# Patient Record
Sex: Female | Born: 1960
Health system: Southern US, Community
[De-identification: ages and names within clinical notes are randomized; demographics above are authoritative.]

## PROBLEM LIST (undated history)

## (undated) DIAGNOSIS — K5190 Ulcerative colitis, unspecified, without complications: Secondary | ICD-10-CM

## (undated) DIAGNOSIS — E785 Hyperlipidemia, unspecified: Secondary | ICD-10-CM

## (undated) DIAGNOSIS — K625 Hemorrhage of anus and rectum: Secondary | ICD-10-CM

## (undated) HISTORY — PX: WRIST SURGERY: SHX841

## (undated) HISTORY — PX: TUBAL LIGATION: SHX77

## (undated) HISTORY — DX: Hemorrhage of anus and rectum: K62.5

## (undated) HISTORY — DX: Ulcerative colitis, unspecified, without complications: K51.90

## (undated) HISTORY — DX: Hyperlipidemia, unspecified: E78.5

---

## 2002-03-19 ENCOUNTER — Emergency Department (HOSPITAL_COMMUNITY): Admission: EM | Admit: 2002-03-19 | Discharge: 2002-03-19 | Payer: Self-pay | Admitting: *Deleted

## 2002-03-27 ENCOUNTER — Encounter: Payer: Self-pay | Admitting: Emergency Medicine

## 2002-03-27 ENCOUNTER — Emergency Department (HOSPITAL_COMMUNITY): Admission: EM | Admit: 2002-03-27 | Discharge: 2002-03-27 | Payer: Self-pay | Admitting: Emergency Medicine

## 2005-08-21 ENCOUNTER — Ambulatory Visit (HOSPITAL_COMMUNITY): Admission: RE | Admit: 2005-08-21 | Discharge: 2005-08-21 | Payer: Self-pay | Admitting: Family Medicine

## 2006-05-13 ENCOUNTER — Emergency Department (HOSPITAL_COMMUNITY): Admission: EM | Admit: 2006-05-13 | Discharge: 2006-05-13 | Payer: Self-pay | Admitting: Emergency Medicine

## 2007-01-02 IMAGING — CR DG CHEST 2V
2 series · 2 of 2 positions shown · non-contrast
Comparison: None.

CLINICAL DATA: Asthma. Shortness of breath.

[view not recorded (1 of 2)]
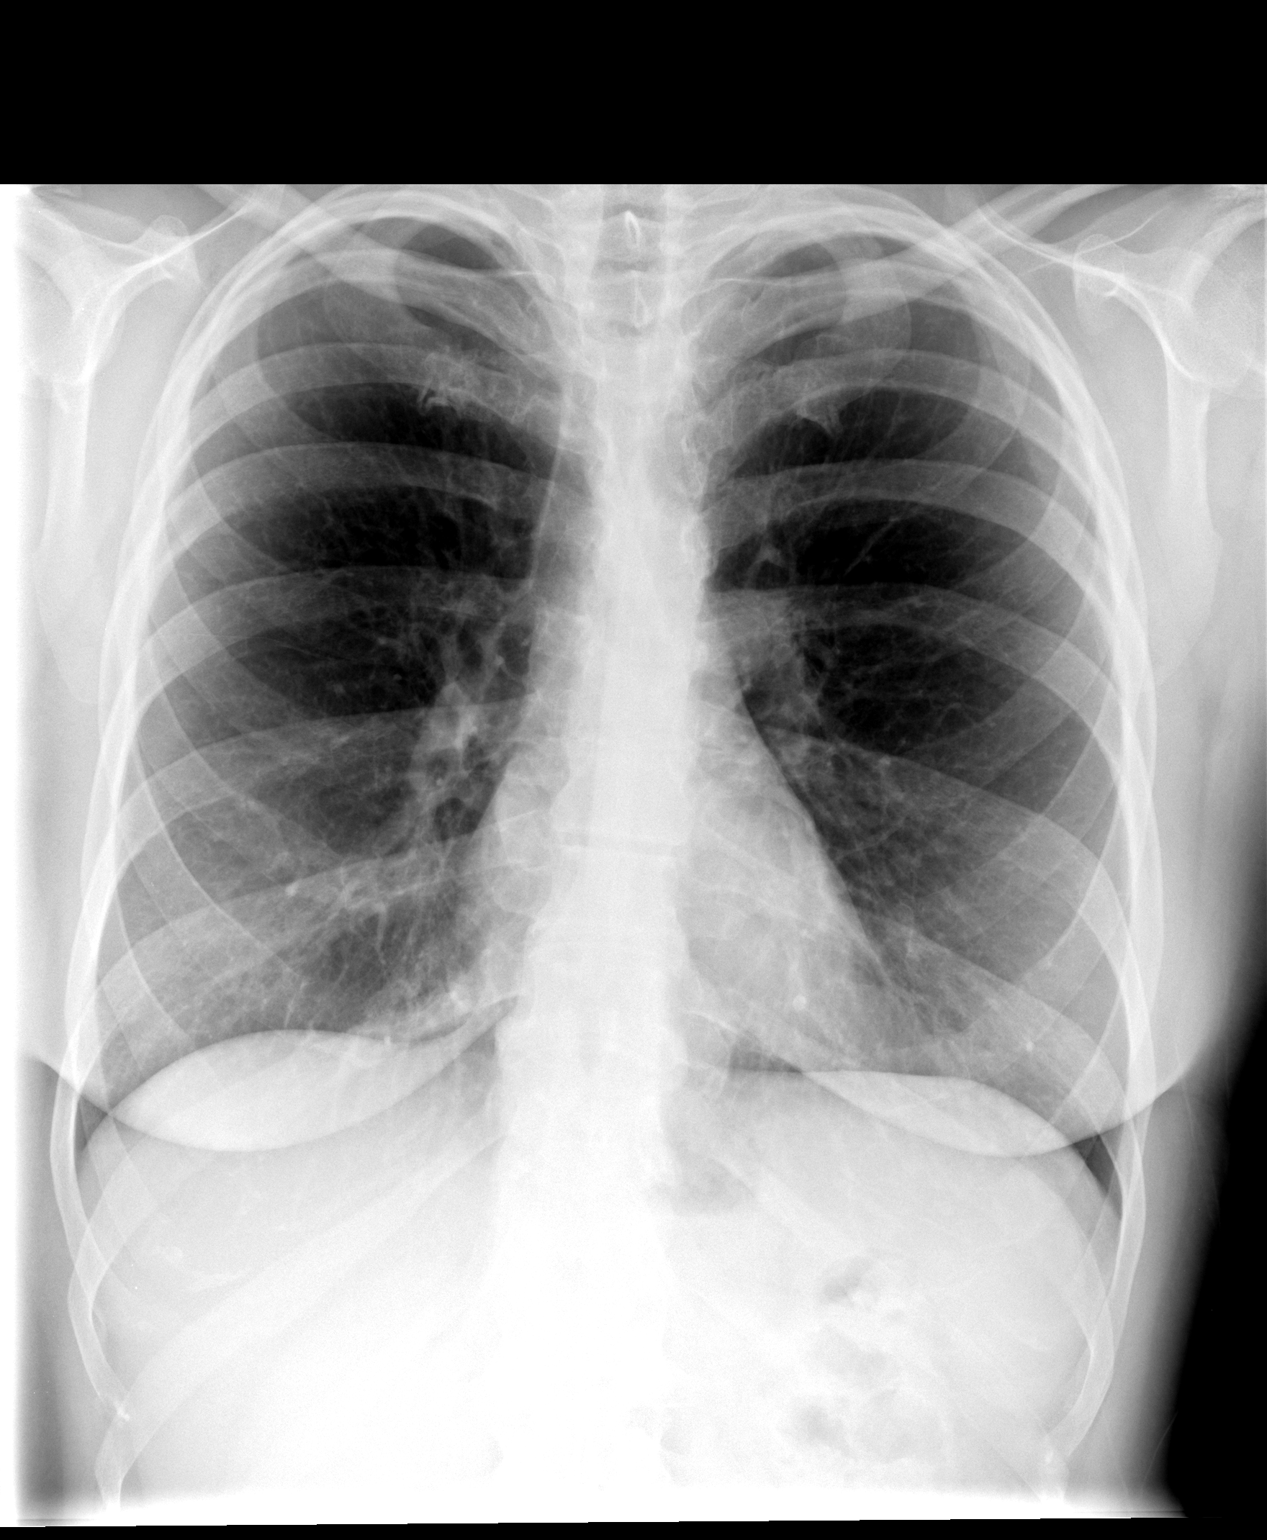

[view not recorded (2 of 2)]
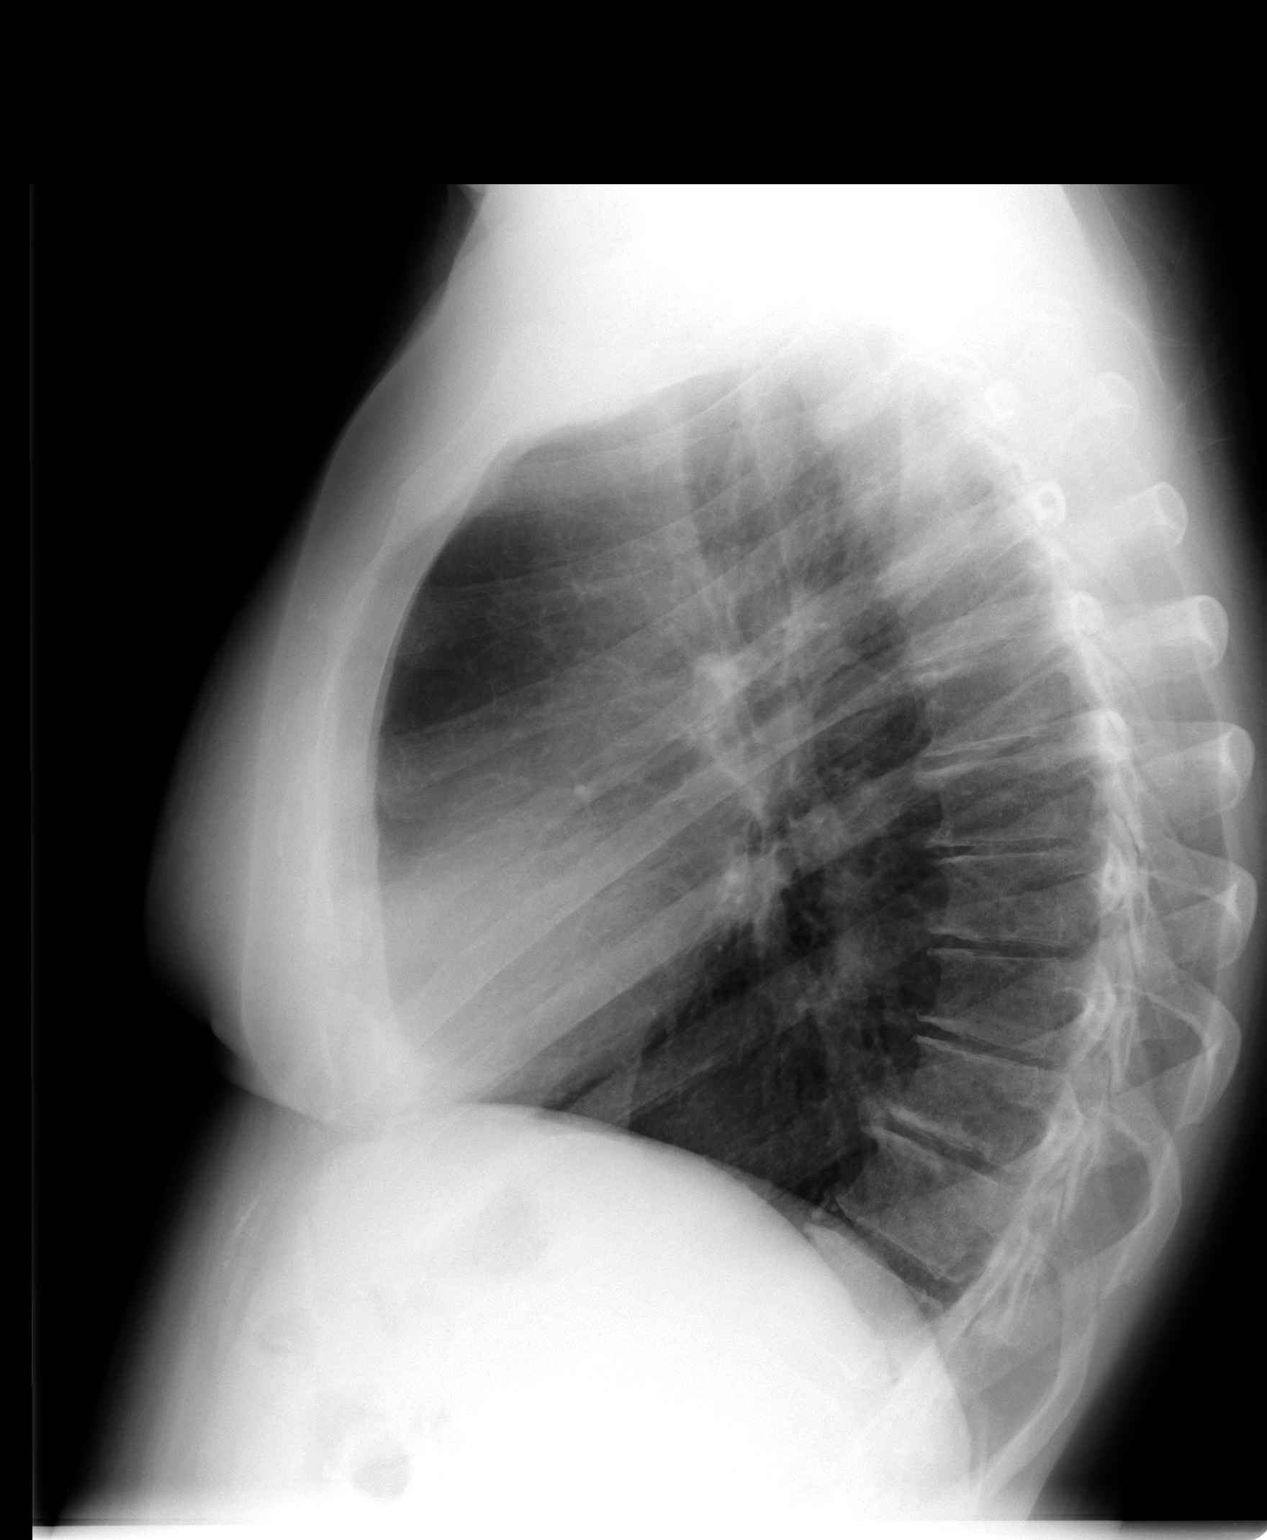

[2 of 2 positions shown; findings below may reference images not displayed]

CHEST - 2 VIEW:

The lungs are clear.  The cardiopericardial silhouette is within normal limits
for size.  Visualized bony structures of the thorax are intact.
IMPRESSION: No acute cardiopulmonary process

## 2007-06-28 ENCOUNTER — Ambulatory Visit (HOSPITAL_COMMUNITY): Admission: RE | Admit: 2007-06-28 | Discharge: 2007-06-28 | Payer: Self-pay | Admitting: Family Medicine

## 2007-09-15 ENCOUNTER — Emergency Department (HOSPITAL_COMMUNITY): Admission: EM | Admit: 2007-09-15 | Discharge: 2007-09-15 | Payer: Self-pay | Admitting: Emergency Medicine

## 2010-07-21 ENCOUNTER — Ambulatory Visit (HOSPITAL_COMMUNITY): Admission: RE | Admit: 2010-07-21 | Discharge: 2010-07-21 | Payer: Self-pay | Admitting: Family Medicine

## 2011-04-02 ENCOUNTER — Encounter: Payer: Self-pay | Admitting: *Deleted

## 2011-04-02 ENCOUNTER — Emergency Department (HOSPITAL_COMMUNITY)
Admission: EM | Admit: 2011-04-02 | Discharge: 2011-04-02 | Disposition: A | Discharge status: Home / Self Care | Payer: PRIVATE HEALTH INSURANCE | Attending: Emergency Medicine | Admitting: Emergency Medicine

## 2011-04-02 ENCOUNTER — Emergency Department (HOSPITAL_COMMUNITY): Payer: PRIVATE HEALTH INSURANCE

## 2011-04-02 DIAGNOSIS — T148XXA Other injury of unspecified body region, initial encounter: Secondary | ICD-10-CM

## 2011-04-02 DIAGNOSIS — IMO0002 Reserved for concepts with insufficient information to code with codable children: Secondary | ICD-10-CM | POA: Insufficient documentation

## 2011-04-02 DIAGNOSIS — J45909 Unspecified asthma, uncomplicated: Secondary | ICD-10-CM | POA: Insufficient documentation

## 2011-04-02 DIAGNOSIS — S40019A Contusion of unspecified shoulder, initial encounter: Secondary | ICD-10-CM | POA: Insufficient documentation

## 2011-04-02 DIAGNOSIS — W010XXA Fall on same level from slipping, tripping and stumbling without subsequent striking against object, initial encounter: Secondary | ICD-10-CM | POA: Insufficient documentation

## 2011-04-02 DIAGNOSIS — Y9269 Other specified industrial and construction area as the place of occurrence of the external cause: Secondary | ICD-10-CM | POA: Insufficient documentation

## 2011-04-02 NOTE — ED Notes (Signed)
Pt states she slipped in grease this am at work and hit her left shoulder.

## 2011-04-02 NOTE — ED Provider Notes (Signed)
History     CSN: [id number] Arrival date & time: 04/02/2011 12:34 PM  Chief Complaint  Patient presents with  . Fall    pt slipped in grease and hit left shoulder   HPI Comments: Patient c/o pain to her left scapula after she slipped, fell at work.  States she landed on her left shoulder.  She denies other injuries, numbness, weakness or LOC.  Patient is a 50 y.o. female presenting with shoulder injury. The history is provided by the patient.  Shoulder Injury This is a new problem. The current episode started today. The problem occurs constantly. The problem has been gradually improving. Associated symptoms include arthralgias. Pertinent negatives include no abdominal pain, chest pain, fever, joint swelling, nausea, neck pain, numbness, vomiting or weakness. Exacerbated by: movement. She has tried nothing for the symptoms. The treatment provided no relief.    Past Medical History  Diagnosis Date  . Asthma     History reviewed. No pertinent past surgical history.  History reviewed. No pertinent family history.  History  Substance Use Topics  . Smoking status: Never Smoker   . Smokeless tobacco: Never Used  . Alcohol Use: No    OB History    Grav Para Term Preterm Abortions TAB SAB Ect Mult Living                  Review of Systems  Constitutional: Negative for fever.  HENT: Negative for neck pain and neck stiffness.   Eyes: Negative for visual disturbance.  Cardiovascular: Negative for chest pain.  Gastrointestinal: Negative for nausea, vomiting and abdominal pain.  Musculoskeletal: Positive for arthralgias. Negative for joint swelling.  Neurological: Negative for weakness and numbness.  Hematological: Does not bruise/bleed easily.  All other systems reviewed and are negative.    Physical Exam  BP 149/71  Pulse 95  Temp(Src) 98.2 F (36.8 C) (Oral)  Resp 20  Ht 5' 4"  (1.626 m)  Wt 160 lb (72.576 kg)  BMI 27.46 kg/m2  SpO2 100%  LMP 02/01/2011  Physical  Exam  Nursing note and vitals reviewed. Constitutional: She is oriented to person, place, and time. She appears well-developed and well-nourished. No distress.  HENT:  Head: Normocephalic and atraumatic.  Mouth/Throat: Oropharynx is clear and moist.  Neck: Normal range of motion. Neck supple.  Cardiovascular: Normal rate, regular rhythm and normal heart sounds.   Pulmonary/Chest: Effort normal and breath sounds normal.  Musculoskeletal: She exhibits tenderness. She exhibits no edema.       Arms: Lymphadenopathy:    She has no cervical adenopathy.  Neurological: She is oriented to person, place, and time. She has normal reflexes. She exhibits normal muscle tone. Coordination normal.  Skin: Skin is warm and dry.    ED Course  Procedures  MDM  Small superificial abrasion to left scapula with ttp of that area.  Pain also reproduced with abduction of the left arm.  No spinal tenderness, LOC, neck pain or headache.  Distal sensation intact,  Radial pulse is strong.  I have reviewed the x-ray results and discussed them with the patient.       Dg Scapula Left  04/02/2011  *RADIOLOGY REPORT*  Clinical Data: Fall, pain.  LEFT SCAPULA - 2+ VIEWS  Comparison: None.  Findings: There is an area of mild cortical irregularity noted in the inferior aspect of the scapula on the second view (Y-view). This cannot be confirmed on the second view which is suboptimal. No proximal humeral abnormality.  IMPRESSION: Subtle area  of cortical irregularity seen only on the Y-view.  I would recommend a three-view shoulder for better visualization if there is high clinical suspicion.  Original Report Authenticated By: Raelyn Number, M.D.   Dg Shoulder Left  04/02/2011  *RADIOLOGY REPORT*  Clinical Data: Fall.  Abnormal scapular views.  LEFT SHOULDER - 2+ VIEW  Comparison: 04/02/2011, 1442 hours.  Findings: Left shoulder is located.  Internal and external rotation views are normal.  Scapula is within normal limits.   There is a small area of cortical prominence in the inferior scapula representing the area of irregularity seen on prior exam, which is compatible with an area of muscular attachment and a small tug lesion.  Visualized left chest is unremarkable.  AC joint appears within normal limits.  IMPRESSION: No scapular fracture. Acute osseous injury.  Original Report Authenticated By: Dereck Ligas, M.D.     Sundai Probert L. Vanessa [place], [place] 04/09/11 2013

## 2011-04-12 NOTE — ED Provider Notes (Signed)
Medical screening examination/treatment/procedure(s) were performed by non-physician practitioner and as supervising physician I was immediately available for consultation/collaboration.   Hoy Morn, MD 04/12/11 [phone]

## 2011-08-28 ENCOUNTER — Other Ambulatory Visit (HOSPITAL_COMMUNITY): Payer: Self-pay | Admitting: Family Medicine

## 2011-08-28 DIAGNOSIS — Z139 Encounter for screening, unspecified: Secondary | ICD-10-CM

## 2011-09-04 ENCOUNTER — Ambulatory Visit (HOSPITAL_COMMUNITY)
Admission: RE | Admit: 2011-09-04 | Discharge: 2011-09-04 | Disposition: A | Discharge status: Home / Self Care | Payer: 59 | Source: Ambulatory Visit | Attending: Family Medicine | Admitting: Family Medicine

## 2011-09-04 DIAGNOSIS — Z139 Encounter for screening, unspecified: Secondary | ICD-10-CM

## 2011-09-04 DIAGNOSIS — Z1231 Encounter for screening mammogram for malignant neoplasm of breast: Secondary | ICD-10-CM | POA: Insufficient documentation

## 2012-08-31 ENCOUNTER — Other Ambulatory Visit (HOSPITAL_COMMUNITY): Payer: Self-pay | Admitting: Family Medicine

## 2012-08-31 DIAGNOSIS — Z139 Encounter for screening, unspecified: Secondary | ICD-10-CM

## 2012-09-12 ENCOUNTER — Ambulatory Visit (HOSPITAL_COMMUNITY)
Admission: RE | Admit: 2012-09-12 | Discharge: 2012-09-12 | Disposition: A | Discharge status: Home / Self Care | Payer: 59 | Source: Ambulatory Visit | Attending: Family Medicine | Admitting: Family Medicine

## 2012-09-12 DIAGNOSIS — Z1231 Encounter for screening mammogram for malignant neoplasm of breast: Secondary | ICD-10-CM | POA: Insufficient documentation

## 2012-09-12 DIAGNOSIS — Z139 Encounter for screening, unspecified: Secondary | ICD-10-CM

## 2013-11-15 ENCOUNTER — Other Ambulatory Visit (HOSPITAL_COMMUNITY): Payer: Self-pay | Admitting: Family Medicine

## 2013-11-15 DIAGNOSIS — Z139 Encounter for screening, unspecified: Secondary | ICD-10-CM

## 2013-11-23 ENCOUNTER — Ambulatory Visit (HOSPITAL_COMMUNITY)
Admission: RE | Admit: 2013-11-23 | Discharge: 2013-11-23 | Disposition: A | Discharge status: Home / Self Care | Payer: 59 | Source: Ambulatory Visit | Attending: Family Medicine | Admitting: Family Medicine

## 2013-11-23 DIAGNOSIS — Z139 Encounter for screening, unspecified: Secondary | ICD-10-CM

## 2013-11-23 DIAGNOSIS — Z1231 Encounter for screening mammogram for malignant neoplasm of breast: Secondary | ICD-10-CM | POA: Insufficient documentation

## 2015-06-19 ENCOUNTER — Other Ambulatory Visit (HOSPITAL_COMMUNITY): Payer: Self-pay | Admitting: Family Medicine

## 2015-06-19 DIAGNOSIS — Z1231 Encounter for screening mammogram for malignant neoplasm of breast: Secondary | ICD-10-CM

## 2015-07-04 ENCOUNTER — Ambulatory Visit (HOSPITAL_COMMUNITY)
Admission: RE | Admit: 2015-07-04 | Discharge: 2015-07-04 | Disposition: A | Discharge status: Home / Self Care | Payer: 59 | Source: Ambulatory Visit | Attending: Family Medicine | Admitting: Family Medicine

## 2015-07-04 DIAGNOSIS — Z1231 Encounter for screening mammogram for malignant neoplasm of breast: Secondary | ICD-10-CM | POA: Diagnosis present

## 2015-08-20 ENCOUNTER — Encounter (INDEPENDENT_AMBULATORY_CARE_PROVIDER_SITE_OTHER): Payer: Self-pay | Admitting: *Deleted

## 2015-08-21 ENCOUNTER — Emergency Department (HOSPITAL_COMMUNITY)
Admission: EM | Admit: 2015-08-21 | Discharge: 2015-08-21 | Disposition: A | Discharge status: Home / Self Care | Payer: 59 | Source: Home / Self Care

## 2015-08-21 ENCOUNTER — Encounter (HOSPITAL_COMMUNITY): Payer: Self-pay

## 2015-08-21 DIAGNOSIS — H6123 Impacted cerumen, bilateral: Secondary | ICD-10-CM

## 2015-08-21 DIAGNOSIS — K625 Hemorrhage of anus and rectum: Secondary | ICD-10-CM | POA: Diagnosis not present

## 2015-08-21 LAB — POCT I-STAT, CHEM 8
BUN: 9 mg/dL (ref 6–20)
CALCIUM ION: 1.13 mmol/L (ref 1.12–1.23)
CREATININE: 0.7 mg/dL (ref 0.44–1.00)
Chloride: 105 mmol/L (ref 101–111)
GLUCOSE: 95 mg/dL (ref 65–99)
HCT: 44 % (ref 36.0–46.0)
HEMOGLOBIN: 15 g/dL (ref 12.0–15.0)
POTASSIUM: 3.8 mmol/L (ref 3.5–5.1)
Sodium: 142 mmol/L (ref 135–145)
TCO2: 25 mmol/L (ref 0–100)

## 2015-08-21 MED ORDER — CARBAMIDE PEROXIDE 6.5 % OT SOLN
OTIC | Status: AC
  Filled 2015-08-21: qty 15

## 2015-08-21 NOTE — Discharge Instructions (Signed)
Gastrointestinal Bleeding Gastrointestinal bleeding is bleeding somewhere along the path that food travels through the body (digestive tract). This path is anywhere between the mouth and the opening of the butt (anus). You may have blood in your throw up (vomit) or in your poop (stools). If there is a lot of bleeding, you may need to stay in the hospital. [place]  Only take medicine as told by your doctor.  Eat foods with fiber such as whole grains, fruits, and vegetables. You can also try eating 1 to 3 prunes a day.  Drink enough fluids to keep your pee (urine) clear or pale yellow. GET HELP RIGHT AWAY IF:   Your bleeding gets worse.  You feel dizzy, weak, or you pass out (faint).  You have bad cramps in your back or belly (abdomen).  You have large blood clumps (clots) in your poop.  Your problems are getting worse. MAKE SURE YOU:   Understand these instructions.  Will watch your condition.  Will get help right away if you are not doing well or get worse.   This information is not intended to replace advice given to you by your health care provider. Make sure you discuss any questions you have with your health care provider.   Document Released: 05/19/2008 Document Revised: 07/27/2012 Document Reviewed: 01/28/2015 Elsevier Interactive Patient Education 2016 [place] Impaction The structures of the external ear canal secrete a waxy substance known as cerumen. Excess cerumen can build up in the ear canal, causing a condition known as cerumen impaction. Cerumen impaction can cause ear pain and disrupt the function of the ear. The rate of cerumen production differs for each individual. In certain individuals, the configuration of the ear canal may decrease his or her ability to naturally remove cerumen. CAUSES Cerumen impaction is caused by excessive cerumen production or buildup. RISK FACTORS  Frequent use of swabs to clean ears.  Having narrow ear  canals.  Having eczema.  Being dehydrated. SIGNS AND SYMPTOMS  Diminished hearing.  Ear drainage.  Ear pain.  Ear itch. TREATMENT Treatment may involve:  Over-the-counter or prescription ear drops to soften the cerumen.  Removal of cerumen by a health care provider. This may be done with:  Irrigation with warm water. This is the most common method of removal.  Ear curettes and other instruments.  Surgery. This may be done in severe cases. HOME CARE INSTRUCTIONS  Take medicines only as directed by your health care provider.  Do not insert objects into the ear with the intent of cleaning the ear. PREVENTION  Do not insert objects into the ear, even with the intent of cleaning the ear. Removing cerumen as a part of normal hygiene is not necessary, and the use of swabs in the ear canal is not recommended.  Drink enough water to keep your urine clear or pale yellow.  Control your eczema if you have it. SEEK MEDICAL CARE IF:  You develop ear pain.  You develop bleeding from the ear.  The cerumen does not clear after you use ear drops as directed.   This information is not intended to replace advice given to you by your health care provider. Make sure you discuss any questions you have with your health care provider.   Document Released: 09/17/2004 Document Revised: 08/31/2014 Document Reviewed: 03/27/2015 Elsevier Interactive Patient Education [employer].

## 2015-08-21 NOTE — ED Provider Notes (Signed)
CSN: [id number]     Arrival date & time 08/21/15  1303 History   None    Chief Complaint  Patient presents with  . Rectal Bleeding  . Ear Problem   (Consider location/radiation/quality/duration/timing/severity/associated sxs/prior Treatment) HPI 2 complaints #1 Rectal Bleeding 3 days With stools Treatment includes increased fiber No other symptoms Episodic with BM No pain #2 Can't hear Both ears 2 weeks No treatment No pain Onset unsure Past Medical History  Diagnosis Date  . Asthma    History reviewed. No pertinent past surgical history. History reviewed. No pertinent family history. Social History  Substance Use Topics  . Smoking status: Never Smoker   . Smokeless tobacco: Never Used  . Alcohol Use: No   OB History    No data available     Review of Systems ROS +'ve decreased hearing, rectal bleeding  Denies: HEADACHE, NAUSEA, ABDOMINAL PAIN, CHEST PAIN, CONGESTION, DYSURIA, SHORTNESS OF BREATH  Allergies  Review of patient's allergies indicates no known allergies.  Home Medications   Prior to Admission medications   Medication Sig Start Date End Date Taking? Authorizing Provider  albuterol (PROVENTIL HFA;VENTOLIN HFA) 108 (90 BASE) MCG/ACT inhaler Inhale 2 puffs into the lungs every 6 (six) hours as needed. FOR SHORTNESS OF BREATH     Historical Provider, MD  diphenhydrAMINE (BENADRYL) 25 MG tablet Take 50 mg by mouth every 6 (six) hours as needed. FOR ALLERGIES     Historical Provider, MD  ibuprofen (ADVIL,MOTRIN) 200 MG tablet Take 400 mg by mouth every 6 (six) hours as needed. OTC FOR PAIN     Historical Provider, MD   Meds Ordered and Administered this Visit  Medications - No data to display  BP 153/62 mmHg  Pulse 90  Temp(Src) 98.3 F (36.8 C) (Oral)  SpO2 100%  LMP 02/01/2011 No data found.   Physical Exam  Constitutional: She is oriented to person, place, and time. She appears well-developed and well-nourished.  HENT:  Head:  Normocephalic and atraumatic.  Right Ear: External ear normal.  Left Ear: External ear normal.  Eyes: Conjunctivae are normal.  Neck: Normal range of motion.  Pulmonary/Chest: Effort normal and breath sounds normal.  Abdominal: Soft. Bowel sounds are normal. There is no tenderness. There is no rebound and no guarding.  Musculoskeletal: Normal range of motion.  Neurological: She is alert and oriented to person, place, and time.  Skin: Skin is warm and dry.  Psychiatric: She has a normal mood and affect. Her behavior is normal. Judgment and thought content normal.  Nursing note and vitals reviewed.   ED Course  Procedures (including critical care time)  Labs Review Labs Reviewed - No data to display  Imaging Review No results found.   Visual Acuity Review  Right Eye Distance:   Left Eye Distance:   Bilateral Distance:    Right Eye Near:   Left Eye Near:    Bilateral Near:         MDM  No diagnosis found.   Pt states her hearing is normal now.  Rectal bleeding is stable with hb 15 hct 44. Referral to Dr. Laural Golden in [place]. Pt states this is who she wishes to follow up with. No new meds are prescribe. She is advised that if bleeding worsens or she becomes light headed, sensation of fainting etc, she should go to the ER.     Konrad Felix, PA 08/21/15 [phone]

## 2015-08-21 NOTE — ED Notes (Signed)
Debrox, warm water irrigation bilateral. LARGE cerumen plug from right ear, small one from left

## 2015-08-21 NOTE — ED Notes (Signed)
States shes has been having ear stuffiness x couple of weeks, and rectal bleeding x couple of days. States stool 4-5 x day (usually 1 stool QOD, QD) stool firm , not hard. Denies straining on stool. No new food or medications

## 2015-08-23 ENCOUNTER — Encounter (INDEPENDENT_AMBULATORY_CARE_PROVIDER_SITE_OTHER): Payer: Self-pay | Admitting: *Deleted

## 2015-09-02 ENCOUNTER — Encounter (INDEPENDENT_AMBULATORY_CARE_PROVIDER_SITE_OTHER): Payer: Self-pay | Admitting: Internal Medicine

## 2015-09-02 ENCOUNTER — Other Ambulatory Visit (INDEPENDENT_AMBULATORY_CARE_PROVIDER_SITE_OTHER): Payer: Self-pay | Admitting: Internal Medicine

## 2015-09-02 ENCOUNTER — Ambulatory Visit (INDEPENDENT_AMBULATORY_CARE_PROVIDER_SITE_OTHER): Payer: 59 | Admitting: Internal Medicine

## 2015-09-02 ENCOUNTER — Encounter (INDEPENDENT_AMBULATORY_CARE_PROVIDER_SITE_OTHER): Payer: Self-pay | Admitting: *Deleted

## 2015-09-02 VITALS — BP 140/72 | HR 60 | Temp 97.3°F | Ht 65.0 in | Wt 165.0 lb

## 2015-09-02 DIAGNOSIS — J4520 Mild intermittent asthma, uncomplicated: Secondary | ICD-10-CM

## 2015-09-02 DIAGNOSIS — K625 Hemorrhage of anus and rectum: Secondary | ICD-10-CM

## 2015-09-02 DIAGNOSIS — J45909 Unspecified asthma, uncomplicated: Secondary | ICD-10-CM | POA: Insufficient documentation

## 2015-09-02 NOTE — Progress Notes (Signed)
   Subjective:    [name], female    DOB: 1961/07/10, 55 y.o.   MRN: [id number]   HPI Referred to our office by [facility] at [facility] on 08/21/2015 for rectal bleeding. She says she has rectal bleeding which occurs every day. This has been occurring for about a month. She describes a bright red in color. Her stools are brown in color. She is having 3-4 stools a day which is not normal. Before this, she was having one stool a day or every other day. Really no weight loss.  Appetite is good. When she has to have a BM she has rectal pain.  Stools are not formed. She says stools are loose and watery.  She has never had a colonoscopy in the past.  No family hx of colon cancer. No family hx of Crohn's or UC.  No NSAIDs.  No recent antibiotics.    08/21/2015 H and H 15.0 and 44.0 Review of Systems Past Medical History  Diagnosis Date  . Asthma   . Rectal bleeding     Past Surgical History  Procedure Laterality Date  . Wrist surgery      rt wrist surgery x 2 for a varicose vein  . Tubal ligation      1992    No Known Allergies  Current Outpatient Prescriptions on File Prior to Visit  Medication Sig Dispense Refill  . diphenhydrAMINE (BENADRYL) 25 MG tablet Take 50 mg by mouth every 6 (six) hours as needed. FOR ALLERGIES     . ibuprofen (ADVIL,MOTRIN) 200 MG tablet Take 400 mg by mouth every 6 (six) hours as needed. OTC FOR PAIN      No current facility-administered medications on file prior to visit.        Objective:   Physical Exam  Filed Vitals:   09/02/15 1106  Height: 5' 5"  (1.651 m)  Weight: 165 lb (74.844 kg)   Alert and oriented. Skin warm and dry. Oral mucosa is moist.   . Sclera anicteric, conjunctivae is pink. Thyroid not enlarged. No cervical lymphadenopathy. Lungs clear. Heart regular rate and rhythm.  Abdomen is soft. Bowel sounds are positive. No hepatomegaly. No abdominal masses felt. No tenderness.  No edema to lower extremities.   Stools  grossly positive (bright red). She had tenderness during rectal exam.     Lot [id number] Ex 9/17    Assessment & Plan:  Rectal bleeding. Colonic neoplasm needs to be ruled out. Diverticular disease, polyp, Hemorrhoid also in the differential. Inflammatory bowel disease also in the differential.  Colonoscopy. The risks and benefits such as perforation, bleeding, and infection were reviewed with the patient and is agreeable. Will also get a GI pathogen.

## 2015-09-02 NOTE — Patient Instructions (Signed)
Colonoscopy.  The risks and benefits such as perforation, bleeding, and infection were reviewed with the patient and is agreeable. 

## 2015-09-04 ENCOUNTER — Encounter (HOSPITAL_COMMUNITY): Payer: Self-pay | Admitting: *Deleted

## 2015-09-04 ENCOUNTER — Ambulatory Visit (HOSPITAL_COMMUNITY)
Admission: RE | Admit: 2015-09-04 | Discharge: 2015-09-04 | Disposition: A | Discharge status: Home Health | Payer: 59 | Source: Ambulatory Visit | Attending: Internal Medicine | Admitting: Internal Medicine

## 2015-09-04 ENCOUNTER — Encounter (HOSPITAL_COMMUNITY)
Admission: RE | Disposition: A | Discharge status: Home Health | Payer: Self-pay | Source: Ambulatory Visit | Attending: Internal Medicine

## 2015-09-04 DIAGNOSIS — K625 Hemorrhage of anus and rectum: Secondary | ICD-10-CM

## 2015-09-04 DIAGNOSIS — K921 Melena: Secondary | ICD-10-CM | POA: Insufficient documentation

## 2015-09-04 DIAGNOSIS — R1030 Lower abdominal pain, unspecified: Secondary | ICD-10-CM | POA: Diagnosis not present

## 2015-09-04 DIAGNOSIS — K529 Noninfective gastroenteritis and colitis, unspecified: Secondary | ICD-10-CM | POA: Insufficient documentation

## 2015-09-04 DIAGNOSIS — R197 Diarrhea, unspecified: Secondary | ICD-10-CM | POA: Diagnosis not present

## 2015-09-04 HISTORY — PX: COLONOSCOPY: SHX5424

## 2015-09-04 SURGERY — COLONOSCOPY
Anesthesia: Moderate Sedation

## 2015-09-04 MED ORDER — LIDOCAINE HCL 2 % EX GEL
CUTANEOUS | Status: DC | PRN
  Administered 2015-09-04: 1 via TOPICAL

## 2015-09-04 MED ORDER — LIDOCAINE HCL 2 % EX GEL
CUTANEOUS | Status: AC
  Filled 2015-09-04: qty 30

## 2015-09-04 MED ORDER — MEPERIDINE HCL 50 MG/ML IJ SOLN
INTRAMUSCULAR | Status: DC | PRN
  Administered 2015-09-04 (×2): 25 mg via INTRAVENOUS

## 2015-09-04 MED ORDER — MIDAZOLAM HCL 5 MG/5ML IJ SOLN
INTRAMUSCULAR | Status: DC | PRN
  Administered 2015-09-04 (×5): 2 mg via INTRAVENOUS

## 2015-09-04 MED ORDER — SIMETHICONE 40 MG/0.6ML PO SUSP
ORAL | Status: DC | PRN
  Administered 2015-09-04: 11:00:00

## 2015-09-04 MED ORDER — MIDAZOLAM HCL 5 MG/5ML IJ SOLN
INTRAMUSCULAR | Status: AC
  Filled 2015-09-04: qty 10

## 2015-09-04 MED ORDER — MESALAMINE 4 G RE ENEM: 4.0000 g | ENEMA | Freq: Every day | RECTAL | Status: DC

## 2015-09-04 MED ORDER — MEPERIDINE HCL 50 MG/ML IJ SOLN
INTRAMUSCULAR | Status: AC
  Filled 2015-09-04: qty 1

## 2015-09-04 MED ORDER — SODIUM CHLORIDE 0.9 % IV SOLN
INTRAVENOUS | Status: DC
  Administered 2015-09-04: 11:00:00 via INTRAVENOUS

## 2015-09-04 MED FILL — MESALAMINE 4 GM/60 ML ENEMA: 4 | 28 days supply | Qty: 1680 | Fill #0

## 2015-09-04 NOTE — Op Note (Addendum)
COLONOSCOPY PROCEDURE REPORT  PATIENT:  Barbara Martin  MR#:  [id number] Birthdate:  [birth year], 55 y.o., female Endoscopist:  Dr. Rogene Houston, MD  Procedure Date: 09/04/2015  Procedure:   Colonoscopy  Indications: Patient is 55 year old Caucasian female who presents with one month history of bloody diarrhea as well as lower abdominal and rectal pain. H&H two50  weeks ago was 15 and 44.0.  Informed Consent:  The procedure and risks were reviewed with the patient and informed consent was obtained.  Medications:  Demerol 50 mg IV Versed 10 mg IV Moderate sedation started @11 :04 AM  procedure ended-scope out @ 11:31 AM.   Description of procedure:  After a digital rectal exam was performed, that colonoscope was advanced from the anus through the rectum and colon to the area of the cecum, ileocecal valve and appendiceal orifice. The cecum was deeply intubated. These structures were well-seen and photographed for the record. From the level of the cecum and ileocecal valve, the scope was slowly and cautiously withdrawn. The mucosal surfaces were carefully surveyed utilizing scope tip to flexion to facilitate fold flattening as needed. The scope was pulled down into the rectum where a thorough exam including retroflexion was performed. Terminal ileum was also examined.   Findings:   Prep excellent. Normal mucosa of terminal ileum. Normal mucosa of cecum, ascending colon, hepatic flexure, transverse colon, splenic flexure, descending colon and mucosa of proximal sigmoid colon. Single small diverticulum noted at proximal sigmoid colon. Mucosa of distal half of sigmoid colon and rectum revealed diffuse erythema with friable mucosa and multiple erosions and ulcers. Transition was at 37 cm from the anal margin. Similar changes noted involving the rectal mucosa. Anorectal junction unremarkable.   Therapeutic/Diagnostic Maneuvers Performed:   Also biopsies taken from mucosa of sigmoid colon  and rectum and submitted separately.  Complications:  None  EBL: Mild  Cecal Withdrawal Time:  10 minutes  Impression:  Normal mucosa of terminal ileum. Acute colitis involving the rectal mucosa and distal half of sigmoid colon with transition zone at 37 cm from the anal margin. Endoscopic appearance typical of ulcerative colitis. Biopsies taken from mucosa of sigmoid colon and rectum.  Recommendations:  Standard instructions given. Mesalamine enema 4 g per rectum daily at bedtime for 4 weeks. I will contact patient with biopsy results and further recommendations.  Barbara Martin  09/04/2015 11:40 AM  CC: Dr. Rayne Du PCP Per Patient & Dr. No ref. provider found

## 2015-09-04 NOTE — H&P (Signed)
PAISLY FINGERHUT is an 55 y.o. female.   Chief Complaint: Patient is here for colonoscopy. HPI: Patient is 55 year old Caucasian female who presents with one month history of diarrhea and rectal bleeding. She is also experienced lower abdominal and rectal pain. She denies anorexia weight loss. She was seen in urgent care on 08/21/2015 and her H&H was 15 and 44.0. Family history is negative for CRC or IBD. No history of antibiotic use or recent travel outside the country. Patient takes ibuprofen very occasionally.   Past Medical History  Diagnosis Date  . Asthma   . Rectal bleeding     Past Surgical History  Procedure Laterality Date  . Wrist surgery      rt wrist surgery x 2 for a varicose vein  . Tubal ligation      1992    History reviewed. No pertinent family history. Social History:  reports that she has never smoked. She has never used smokeless tobacco. She reports that she does not drink alcohol or use illicit drugs.  Allergies: No Known Allergies  Medications Prior to Admission  Medication Sig Dispense Refill  . diphenhydrAMINE (BENADRYL) 25 MG tablet Take 50 mg by mouth every 6 (six) hours as needed. FOR ALLERGIES     . ibuprofen (ADVIL,MOTRIN) 200 MG tablet Take 400 mg by mouth every 6 (six) hours as needed. OTC FOR PAIN       No results found for this or any previous visit (from the past 48 hour(s)). No results found.  ROS  Blood pressure 142/68, pulse 100, temperature 98.6 F (37 C), temperature source Oral, resp. rate 13, height 5' 5"  (1.651 m), weight 165 lb (74.844 kg), last menstrual period 02/01/2011, SpO2 100 %. Physical Exam  Constitutional: She appears well-developed and well-nourished.  HENT:  Mouth/Throat: Oropharynx is clear and moist.  Eyes: Conjunctivae are normal. No scleral icterus.  Neck: No thyromegaly present.  Cardiovascular: Normal rate, regular rhythm and normal heart sounds.   No murmur heard. Respiratory: Effort normal and breath  sounds normal.  GI:  Abdomen is symmetrical soft and nontender. Organomegaly or masses.  Musculoskeletal: She exhibits no edema.  Lymphadenopathy:    She has no cervical adenopathy.  Neurological: She is alert.  Skin: Skin is warm and dry.     Assessment/Plan Rectal bleeding and diarrhea. Diagnostic colonoscopy.  REHMAN,NAJEEB U 09/04/2015, 10:58 AM

## 2015-09-04 NOTE — Discharge Instructions (Signed)
Do not take ibuprofen or other NSAIDs. Can take Tylenol up to 2 g per day in divided dose for musculoskeletal pain. Resume usual diet. Mesalamine enema 4 g per rectum daily at bedtime for 4 weeks. No driving for 24 hours. Physician will call with biopsy results.  Colonoscopy, Care After Refer to this sheet in the next few weeks. These instructions provide you with information on caring for yourself after your procedure. Your health care provider may also give you more specific instructions. Your treatment has been planned according to current medical practices, but problems sometimes occur. Call your health care provider if you have any problems or questions after your procedure. WHAT TO EXPECT AFTER THE PROCEDURE  After your procedure, it is typical to have the following:  A small amount of blood in your stool.  Moderate amounts of gas and mild abdominal cramping or bloating. HOME CARE INSTRUCTIONS  Do not drive, operate machinery, or sign important documents for 24 hours.  You may shower and resume your regular physical activities, but move at a slower pace for the first 24 hours.  Take frequent rest periods for the first 24 hours.  Walk around or put a warm pack on your abdomen to help reduce abdominal cramping and bloating.  Drink enough fluids to keep your urine clear or pale yellow.  You may resume your normal diet as instructed by your health care provider. Avoid heavy or fried foods that are hard to digest.  Avoid drinking alcohol for 24 hours or as instructed by your health care provider.  Only take over-the-counter or prescription medicines as directed by your health care provider.  If a tissue sample (biopsy) was taken during your procedure:  Do not take aspirin or blood thinners for 7 days, or as instructed by your health care provider.  Do not drink alcohol for 7 days, or as instructed by your health care provider.  Eat soft foods for the first 24 hours. SEEK  MEDICAL CARE IF: You have persistent spotting of blood in your stool 2-3 days after the procedure. SEEK IMMEDIATE MEDICAL CARE IF:  You have more than a small spotting of blood in your stool.  You pass large blood clots in your stool.  Your abdomen is swollen (distended).  You have nausea or vomiting.  You have a fever.  You have increasing abdominal pain that is not relieved with medicine.   This information is not intended to replace advice given to you by your health care provider. Make sure you discuss any questions you have with your health care provider.   Colitis Colitis is inflammation of the colon. Colitis may last a short time (acute) or it may last a long time (chronic). CAUSES This condition may be caused by:  Viruses.  Bacteria.  Reactions to medicine.  Certain autoimmune diseases, such as Crohn disease or ulcerative colitis. SYMPTOMS Symptoms of this condition include:  Diarrhea.  Passing bloody or tarry stool.  Pain.  Fever.  Vomiting.  Tiredness (fatigue).  Weight loss.  Bloating.  Sudden increase in abdominal pain.  Having fewer bowel movements than usual. DIAGNOSIS This condition is diagnosed with a stool test or a blood test. You may also have other tests, including X-rays, a CT scan, or a colonoscopy. TREATMENT Treatment may include:  Resting the bowel. This involves not eating or drinking for a period of time.  Fluids that are given through an IV tube.  Medicine for pain and diarrhea.  Antibiotic medicines.  Cortisone medicines.  Surgery. HOME CARE INSTRUCTIONS Eating and Drinking  Follow instructions from your health care provider about eating or drinking restrictions.  Drink enough fluid to keep your urine clear or pale yellow.  Work with a dietitian to determine which foods cause your condition to flare up.  Avoid foods that cause flare-ups.  Eat a well-balanced diet. Medicines  Take over-the-counter and  prescription medicines only as told by your health care provider.  If you were prescribed an antibiotic medicine, take it as told by your health care provider. Do not stop taking the antibiotic even if you start to feel better. General Instructions  Keep all follow-up visits as told by your health care provider. This is important. SEEK MEDICAL CARE IF:  Your symptoms do not go away.  You develop new symptoms. SEEK IMMEDIATE MEDICAL CARE IF:  You have a fever that does not go away with treatment.  You develop chills.  You have extreme weakness, fainting, or dehydration.  You have repeated vomiting.  You develop severe pain in your abdomen.  You pass bloody or tarry stool.   This information is not intended to replace advice given to you by your health care provider. Make sure you discuss any questions you have with your health care provider.

## 2015-09-05 LAB — GASTROINTESTINAL PATHOGEN PANEL PCR
C. difficile Tox A/B, PCR: NEGATIVE
CAMPYLOBACTER, PCR: NEGATIVE
CRYPTOSPORIDIUM, PCR: NEGATIVE
E coli (ETEC) LT/ST PCR: NEGATIVE
E coli (STEC) stx1/stx2, PCR: NEGATIVE
E coli 0157, PCR: NEGATIVE
GIARDIA LAMBLIA, PCR: NEGATIVE
NOROVIRUS, PCR: NEGATIVE
ROTAVIRUS, PCR: NEGATIVE
Salmonella, PCR: NEGATIVE
Shigella, PCR: NEGATIVE

## 2015-09-06 ENCOUNTER — Encounter (HOSPITAL_COMMUNITY): Payer: Self-pay | Admitting: Internal Medicine

## 2015-09-08 ENCOUNTER — Other Ambulatory Visit (INDEPENDENT_AMBULATORY_CARE_PROVIDER_SITE_OTHER): Payer: Self-pay | Admitting: Internal Medicine

## 2015-09-08 MED ORDER — MESALAMINE ER 0.375 G PO CP24: 1500.0000 mg | ORAL_CAPSULE | Freq: Every day | ORAL | Status: DC

## 2015-09-09 MED FILL — APRISO ER 0.375 G CAPSULE: 0.375 | 30 days supply | Qty: 120 | Fill #0

## 2015-09-10 ENCOUNTER — Encounter (INDEPENDENT_AMBULATORY_CARE_PROVIDER_SITE_OTHER): Payer: Self-pay | Admitting: *Deleted

## 2015-09-12 ENCOUNTER — Telehealth (INDEPENDENT_AMBULATORY_CARE_PROVIDER_SITE_OTHER): Payer: Self-pay | Admitting: Internal Medicine

## 2015-09-12 ENCOUNTER — Encounter: Payer: Self-pay | Admitting: *Deleted

## 2015-09-12 NOTE — Telephone Encounter (Signed)
Talked with the patient. After TCS she was given a prescription for the Melsalamine Enemas 4 grams and she is to use 1 enema per rectum at bedtime for 4 weeks. When he got the Bx results, UC, he added the Apriso - patient is to take 4 by mouth daily. She is to take Apriso and use the enemas together. Enemas are to used until they are complete. Patient understood

## 2015-09-12 NOTE — Telephone Encounter (Signed)
Ms. Overbay called wanting to make sure the Rx for the Mesalamine is correct. She said on the Rx it's written that she's to take four pills but she said she was told something different. She also wants to make sure she's supposed to take the pills and continue using the enema. She'd like a phone call regarding this.  Pt's ph# [phone] Thank you.

## 2015-09-23 ENCOUNTER — Ambulatory Visit (INDEPENDENT_AMBULATORY_CARE_PROVIDER_SITE_OTHER): Payer: Self-pay | Admitting: Internal Medicine

## 2015-09-30 ENCOUNTER — Encounter: Payer: Self-pay | Admitting: Family Medicine

## 2015-09-30 ENCOUNTER — Ambulatory Visit (INDEPENDENT_AMBULATORY_CARE_PROVIDER_SITE_OTHER): Payer: 59 | Admitting: Family Medicine

## 2015-09-30 VITALS — BP 140/62 | HR 78 | Temp 98.9°F | Resp 12 | Ht 65.0 in | Wt 168.0 lb

## 2015-09-30 DIAGNOSIS — Z124 Encounter for screening for malignant neoplasm of cervix: Secondary | ICD-10-CM

## 2015-09-30 DIAGNOSIS — Z1159 Encounter for screening for other viral diseases: Secondary | ICD-10-CM

## 2015-09-30 DIAGNOSIS — Z0000 Encounter for general adult medical examination without abnormal findings: Secondary | ICD-10-CM

## 2015-09-30 LAB — CBC WITH DIFFERENTIAL/PLATELET
BASOS PCT: 1 % (ref 0–1)
Basophils Absolute: 0.1 10*3/uL (ref 0.0–0.1)
EOS ABS: 0.3 10*3/uL (ref 0.0–0.7)
EOS PCT: 6 % — AB (ref 0–5)
HCT: 40.5 % (ref 36.0–46.0)
HEMOGLOBIN: 13 g/dL (ref 12.0–15.0)
Lymphocytes Relative: 26 % (ref 12–46)
Lymphs Abs: 1.5 10*3/uL (ref 0.7–4.0)
MCH: 29.1 pg (ref 26.0–34.0)
MCHC: 32.1 g/dL (ref 30.0–36.0)
MCV: 90.6 fL (ref 78.0–100.0)
MONO ABS: 0.6 10*3/uL (ref 0.1–1.0)
MONOS PCT: 10 % (ref 3–12)
MPV: 9.7 fL (ref 8.6–12.4)
NEUTROS ABS: 3.3 10*3/uL (ref 1.7–7.7)
Neutrophils Relative %: 57 % (ref 43–77)
Platelets: 350 10*3/uL (ref 150–400)
RBC: 4.47 MIL/uL (ref 3.87–5.11)
RDW: 12.9 % (ref 11.5–15.5)
WBC: 5.8 10*3/uL (ref 4.0–10.5)

## 2015-09-30 LAB — COMPREHENSIVE METABOLIC PANEL
ALBUMIN: 4.1 g/dL (ref 3.6–5.1)
ALT: 21 U/L (ref 6–29)
AST: 17 U/L (ref 10–35)
Alkaline Phosphatase: 72 U/L (ref 33–130)
BUN: 13 mg/dL (ref 7–25)
CHLORIDE: 104 mmol/L (ref 98–110)
CO2: 26 mmol/L (ref 20–31)
CREATININE: 0.67 mg/dL (ref 0.50–1.05)
Calcium: 9.5 mg/dL (ref 8.6–10.4)
GLUCOSE: 85 mg/dL (ref 70–99)
Potassium: 4.2 mmol/L (ref 3.5–5.3)
SODIUM: 139 mmol/L (ref 135–146)
Total Bilirubin: 0.4 mg/dL (ref 0.2–1.2)
Total Protein: 7.1 g/dL (ref 6.1–8.1)

## 2015-09-30 LAB — LIPID PANEL
Cholesterol: 238 mg/dL — ABNORMAL HIGH (ref 125–200)
HDL: 52 mg/dL (ref >=46)
LDL CALC: 163 mg/dL — AB (ref <130)
TRIGLYCERIDES: 114 mg/dL (ref <150)
Total CHOL/HDL Ratio: 4.6 Ratio (ref <=5.0)
VLDL: 23 mg/dL (ref <30)

## 2015-09-30 LAB — TSH: TSH: 1.65 m[IU]/L

## 2015-09-30 NOTE — Progress Notes (Signed)
[name], female   DOB: [birth year], 55 y.o.   MRN: [id number]    Subjective:    [name], female    DOB: 09-Jun-1961, 55 y.o.   MRN: [id number]  Patient presents for CPE with PAP  patient here for complete physical exam and to establish care. She's no particular concerns today. She was being seen [facility]. Past medical history of asthma she is not on any medication for this for past 7 years.. She was recently diagnosed with ulcerative(rectal bleeding. She is followed by gastroenterology for this. She exercises regularly basis. Her immunizations are up-to-date. 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 Her family history was reviewed.  Last PAP > 3 years ago Mammo UTD Colonoscopy- UTD  Eye- Dr. Jorja Martin  Review Of Systems:  GEN- denies fatigue, fever, weight loss,weakness, recent illness HEENT- denies eye drainage, change in vision, nasal discharge, CVS- denies chest pain, palpitations RESP- denies SOB, cough, wheeze ABD- denies N/V, change in stools, abd pain GU- denies dysuria, hematuria, dribbling, incontinence MSK- denies joint pain, muscle aches, injury Neuro- denies headache, dizziness, syncope, seizure activity       Objective:    BP 140/62 mmHg  Pulse 78  Temp(Src) 98.9 F (37.2 C) (Oral)  Resp 12  Ht 5' 5"  (1.651 m)  Wt 168 lb (76.204 kg)  BMI 27.96 kg/m2  LMP 02/01/2011 GEN- NAD, alert and oriented x3 HEENT- PERRL, EOMI, non injected sclera, pink conjunctiva, MMM, oropharynx clear Neck- Supple, no thyromegaly Breast- normal symmetry, no nipple inversion,no nipple drainage, no nodules or lumps felt Nodes- no axillary nodes CVS- RRR, no murmur RESP-CTAB ABD-NABS,soft,NT,ND GU- normal external genitalia, vaginal mucosa pink and moist, cervix visualized no growth, no blood form os, Ni discharge, no CMT, no ovarian masses, uterus normal size EXT- No edema Pulses- Radial, DP- 2+        Assessment &  Plan:      Problem List Items Addressed This Visit    None    Visit Diagnoses    Routine general medical examination at a health care facility    -  Primary    CPE done, fasting labs, immunizations UTD, hep C screening done. Review records    Relevant Orders    CBC with Differential/Platelet (Completed)    Comprehensive metabolic panel (Completed)    Lipid panel (Completed)    TSH (Completed)    Cervical cancer screening        Relevant Orders    PAP, ThinPrep ASCUS Rflx HPV Rflx Type    Need for hepatitis C screening test        Relevant Orders    HCV RNA quant rflx ultra or genotyp (Completed)       Note: This dictation was prepared with Dragon dictation along with smaller phrase technology. Any transcriptional errors that result from this process are unintentional.

## 2015-09-30 NOTE — Patient Instructions (Signed)
I recommend eye visit once a year I recommend dental visit every 6 months Goal is to  Exercise 30 minutes 5 days a week We will send a letter with lab results  F/U 1 year or as needed

## 2015-10-01 LAB — PAP THINPREP ASCUS RFLX HPV RFLX TYPE

## 2015-10-01 LAB — HCV RNA QUANT RFLX ULTRA OR GENOTYP: HCV Quantitative: NOT DETECTED IU/mL (ref <15)

## 2015-10-02 ENCOUNTER — Encounter: Payer: Self-pay | Admitting: Family Medicine

## 2015-10-10 MED FILL — APRISO ER 0.375 G CAPSULE: 0.375 | 30 days supply | Qty: 120 | Fill #1

## 2015-10-29 MED FILL — MESALAMINE 4 GM/60 ML ENEMA: 4 | 28 days supply | Qty: 1680 | Fill #1

## 2015-11-12 ENCOUNTER — Ambulatory Visit (INDEPENDENT_AMBULATORY_CARE_PROVIDER_SITE_OTHER): Payer: 59 | Admitting: Internal Medicine

## 2015-11-12 ENCOUNTER — Encounter (INDEPENDENT_AMBULATORY_CARE_PROVIDER_SITE_OTHER): Payer: Self-pay | Admitting: Internal Medicine

## 2015-11-12 VITALS — BP 162/80 | HR 72 | Temp 97.5°F | Ht 64.0 in | Wt 162.0 lb

## 2015-11-12 DIAGNOSIS — K51011 Ulcerative (chronic) pancolitis with rectal bleeding: Secondary | ICD-10-CM

## 2015-11-12 DIAGNOSIS — K5190 Ulcerative colitis, unspecified, without complications: Secondary | ICD-10-CM | POA: Insufficient documentation

## 2015-11-12 LAB — CBC WITH DIFFERENTIAL/PLATELET
BASOS PCT: 0 % (ref 0–1)
Basophils Absolute: 0 10*3/uL (ref 0.0–0.1)
EOS ABS: 0.7 10*3/uL (ref 0.0–0.7)
Eosinophils Relative: 8 % — ABNORMAL HIGH (ref 0–5)
HCT: 38.6 % (ref 36.0–46.0)
HEMOGLOBIN: 12.2 g/dL (ref 12.0–15.0)
LYMPHS ABS: 1.8 10*3/uL (ref 0.7–4.0)
Lymphocytes Relative: 21 % (ref 12–46)
MCH: 27.7 pg (ref 26.0–34.0)
MCHC: 31.6 g/dL (ref 30.0–36.0)
MCV: 87.7 fL (ref 78.0–100.0)
MONO ABS: 0.8 10*3/uL (ref 0.1–1.0)
MONOS PCT: 9 % (ref 3–12)
MPV: 10 fL (ref 8.6–12.4)
NEUTROS ABS: 5.3 10*3/uL (ref 1.7–7.7)
NEUTROS PCT: 62 % (ref 43–77)
Platelets: 348 10*3/uL (ref 150–400)
RBC: 4.4 MIL/uL (ref 3.87–5.11)
RDW: 13.4 % (ref 11.5–15.5)
WBC: 8.5 10*3/uL (ref 4.0–10.5)

## 2015-11-12 MED FILL — predniSONE 5 MG TABS: 5 | 42 days supply | Qty: 147 | Fill #0

## 2015-11-12 NOTE — Progress Notes (Signed)
   Subjective:    [name], female    DOB: [birth year], 55 y.o.   MRN: [id number]  HPIHere today for f/u after undergoing a colonoscopy in January for bloody diarrhea. Biopsy revealed UC.  (GI pathogen negative). She was started on Mesalamine enemas and started on Apriso1530m daily.  She tells me today she had a refill on the Mesalamine and started back since she is still having some rectal bleeding.  Stools still have some blood. She is having 4 stools. Some stools are formed and some are not.  Appetite is good. No weight loss. She has some left lower abdominal tenderness.   09/04/2015: Colonoscopy  Indications: Patient is 55year old Caucasian female who presents with one month history of bloody diarrhea as well as lower abdominal and rectal pain. H&H two50 weeks ago was 15 and 44.0.  Impression:  Normal mucosa of terminal ileum. Acute colitis involving the rectal mucosa and distal half of sigmoid colon with transition zone at 37 cm from the anal margin. Endoscopic appearance typical of ulcerative colitis. Biopsies taken from mucosa of sigmoid colon and rectum.  Biopsy results reviewed with patient. It shows ulcerative colitis. Patient will continue mesalamine and emesis until prescription runs out. Will start her on Apriso 1500 mg po qd.  Review of Systems Past Medical History  Diagnosis Date  . Asthma   . Rectal bleeding     Past Surgical History  Procedure Laterality Date  . Wrist surgery      rt wrist surgery x 2 for a varicose vein  . Tubal ligation      1992  . Colonoscopy N/A 09/04/2015    Procedure: COLONOSCOPY;  Surgeon: NRogene Houston MD;  [facility];  Service: Endoscopy;  Laterality: N/A;  1:15 - moved to 12:15 - Ann to notify    No Known Allergies  Current Outpatient Prescriptions on File Prior to Visit  Medication Sig Dispense Refill  . mesalamine (APRISO) 0.375 g 24 hr capsule Take 375 mg by mouth 4 (four) times daily.       . Mesalamine-Cleanser 4 g KIT Place 1 kit rectally at bedtime. Reported on 11/12/2015     No current facility-administered medications on file prior to visit.        Objective:   Physical Exam Blood pressure 162/80, pulse 72, temperature 97.5 F (36.4 C), height 5' 4"  (1.626 m), weight 162 lb (73.483 kg), last menstrual period 02/01/2011. Alert and oriented. Skin warm and dry. Oral mucosa is moist.   . Sclera anicteric, conjunctivae is pink. Thyroid not enlarged. No cervical lymphadenopathy. Lungs clear. Heart regular rate and rhythm.  Abdomen is soft. Bowel sounds are positive. No hepatomegaly. No abdominal masses felt. Tenderness left lower abdomen .  No edema to lower extremities.           Assessment & Plan:  UC: she is not in remission at this time. She continues to have rectal bleeding.  Discussed with Dr. RLaural Golden Prednisone 356mx 1 week, then drop by 80m780meekly till finished. Continue the Apriso. OV in 2 months.

## 2015-11-12 NOTE — Patient Instructions (Addendum)
Continue to Apriso. Rx for Prednisone 49m x 1 week and reduce by 515mweekly.  OV in 6 weeks.

## 2015-11-13 ENCOUNTER — Telehealth (INDEPENDENT_AMBULATORY_CARE_PROVIDER_SITE_OTHER): Payer: Self-pay | Admitting: Internal Medicine

## 2015-11-13 LAB — SEDIMENTATION RATE: SED RATE: 11 mm/h (ref 0–30)

## 2015-11-13 NOTE — Telephone Encounter (Signed)
I spoke with pharmacy

## 2015-11-13 NOTE — Telephone Encounter (Signed)
Manuela Schwartz, a Pharmacist with [facility] left a message asking for specifics regarding the Rx of Prednisone for Ms. Ratto. She needs confirmation of the quantity amount and how long the pt can take the medication.  She filled the medication with 12m tablets and the instructions included the patient taking the required amount beginning at six weeks, then declining at five weeks, then four, then three, then two, then the last week. She said it equaled to being 147 tablets and a 42 day supply. She was also wondering why there's a refill for the medication. SManuela Schwartzwould like a return phone call regarding this.  Susan's ph# [phone] you.

## 2015-11-18 MED FILL — APRISO ER 0.375 G CAPSULE: 0.375 | 30 days supply | Qty: 120 | Fill #2

## 2015-12-18 MED FILL — APRISO ER 0.375 G CAPSULE: 0.375 | 30 days supply | Qty: 120 | Fill #3

## 2015-12-24 ENCOUNTER — Encounter (INDEPENDENT_AMBULATORY_CARE_PROVIDER_SITE_OTHER): Payer: Self-pay | Admitting: Internal Medicine

## 2015-12-24 ENCOUNTER — Ambulatory Visit (INDEPENDENT_AMBULATORY_CARE_PROVIDER_SITE_OTHER): Payer: 59 | Admitting: Internal Medicine

## 2015-12-24 VITALS — BP 132/58 | HR 64 | Temp 98.0°F | Ht 65.0 in | Wt 165.0 lb

## 2015-12-24 DIAGNOSIS — K5120 Ulcerative (chronic) proctitis without complications: Secondary | ICD-10-CM | POA: Diagnosis not present

## 2015-12-24 NOTE — Patient Instructions (Signed)
OV in 3 months.

## 2015-12-24 NOTE — Progress Notes (Signed)
   Subjective:    [name], female    DOB: 1961/07/03, 55 y.o.   MRN: [id number] 11/12/2015 Wt 162. HPIHere today for f/u.She was last seen March of this year. Underwent a colonoscopy in January for blood diarrhea. Biopsy revealed UC. (GI pathogen negative). Presently taking Ap[riso 1576m daily. Her last visit she was still having some rectal bleeding. She was started on Prednisone 34mand taper by 62m71meekly. She has just finished the Prednisone. She tells me she is doing good. She is having one BM and formed. No BRRB now. Appetite is good. She has gained 3 pounds since her last OV.  She denies any abdominal pain.               09/04/2015: Colonoscopy  Indications: Patient is 54 3ar old Caucasian female who presents with one month history of bloody diarrhea as well as lower abdominal and rectal pain. H&H two50 weeks ago was 15 and 44.0.  Impression:  Normal mucosa of terminal ileum. Acute colitis involving the rectal mucosa and distal half of sigmoid colon with transition zone at 37 cm from the anal margin. Endoscopic appearance typical of ulcerative colitis. Biopsies taken from mucosa of sigmoid colon and rectum.  Biopsy results reviewed with patient. It shows ulcerative colitis. Patient will continue mesalamine and emesis until prescription runs out. Will start her on Apriso 1500 mg po qd.  Review of Systems Past Medical History  Diagnosis Date  . Asthma   . Rectal bleeding     Past Surgical History  Procedure Laterality Date  . Wrist surgery      rt wrist surgery x 2 for a varicose vein  . Tubal ligation      1992  . Colonoscopy N/A 09/04/2015    Procedure: COLONOSCOPY;  Surgeon: NajRogene HoustonD;  [facility];  Service: Endoscopy;  Laterality: N/A;  1:15 - moved to 12:15 - Ann to notify    No Known Allergies  Current Outpatient Prescriptions on File Prior to Visit  Medication Sig Dispense Refill  . mesalamine (APRISO)  0.375 g 24 hr capsule Take 375 mg by mouth 4 (four) times daily.      No current facility-administered medications on file prior to visit.        Objective:   Physical Exam Blood pressure 132/58, pulse 64, temperature 98 F (36.7 C), height 5' 5"  (1.651 m), weight 165 lb (74.844 kg), last menstrual period 02/01/2011.  Alert and oriented. Skin warm and dry. Oral mucosa is moist.   . Sclera anicteric, conjunctivae is pink. Thyroid not enlarged. No cervical lymphadenopathy. Lungs clear. Heart regular rate and rhythm.  Abdomen is soft. Bowel sounds are positive. No hepatomegaly. No abdominal masses felt. No tenderness.  No edema to lower extremities.         Assessment & Plan:  UC. She seems to be in remission. She is doing well.   OV in 3 months.

## 2016-01-23 MED FILL — APRISO ER 0.375 G CAPSULE: 0.375 | 30 days supply | Qty: 120 | Fill #4

## 2016-03-03 MED FILL — APRISO ER 0.375 G CAPSULE: 0.375 | 30 days supply | Qty: 120 | Fill #5

## 2016-03-23 ENCOUNTER — Encounter (INDEPENDENT_AMBULATORY_CARE_PROVIDER_SITE_OTHER): Payer: Self-pay | Admitting: Internal Medicine

## 2016-03-25 ENCOUNTER — Ambulatory Visit (INDEPENDENT_AMBULATORY_CARE_PROVIDER_SITE_OTHER): Payer: Self-pay | Admitting: Internal Medicine

## 2016-04-15 ENCOUNTER — Telehealth (INDEPENDENT_AMBULATORY_CARE_PROVIDER_SITE_OTHER): Payer: Self-pay | Admitting: Internal Medicine

## 2016-04-15 MED ORDER — MESALAMINE ER 0.375 G PO CP24
375.0000 mg | ORAL_CAPSULE | Freq: Four times a day (QID) | ORAL | 6 refills | Status: DC
Start: 2016-04-15 — End: 2017-02-04

## 2016-04-15 MED FILL — APRISO ER 0.375 G CAPSULE: 0.375 | 30 days supply | Qty: 120 | Fill #0

## 2016-04-15 NOTE — Telephone Encounter (Signed)
Apriso reordered

## 2016-05-21 MED FILL — APRISO ER 0.375 G CAPSULE: 0.375 | 30 days supply | Qty: 120 | Fill #1

## 2016-06-18 ENCOUNTER — Ambulatory Visit (INDEPENDENT_AMBULATORY_CARE_PROVIDER_SITE_OTHER): Payer: 59 | Admitting: Internal Medicine

## 2016-06-18 ENCOUNTER — Encounter (INDEPENDENT_AMBULATORY_CARE_PROVIDER_SITE_OTHER): Payer: Self-pay

## 2016-06-18 ENCOUNTER — Encounter (INDEPENDENT_AMBULATORY_CARE_PROVIDER_SITE_OTHER): Payer: Self-pay | Admitting: Internal Medicine

## 2016-06-18 VITALS — BP 132/58 | HR 64 | Temp 97.7°F | Ht 64.0 in | Wt 166.3 lb

## 2016-06-18 DIAGNOSIS — K5120 Ulcerative (chronic) proctitis without complications: Secondary | ICD-10-CM | POA: Diagnosis not present

## 2016-06-18 LAB — CBC WITH DIFFERENTIAL/PLATELET
BASOS PCT: 0 %
Basophils Absolute: 0 cells/uL (ref 0–200)
EOS ABS: 630 {cells}/uL — AB (ref 15–500)
EOS PCT: 9 %
HCT: 37.2 % (ref 35.0–45.0)
Hemoglobin: 11.9 g/dL (ref 11.7–15.5)
LYMPHS PCT: 21 %
Lymphs Abs: 1470 cells/uL (ref 850–3900)
MCH: 26.7 pg — ABNORMAL LOW (ref 27.0–33.0)
MCHC: 32 g/dL (ref 32.0–36.0)
MCV: 83.6 fL (ref 80.0–100.0)
MONOS PCT: 8 %
MPV: 9.3 fL (ref 7.5–12.5)
Monocytes Absolute: 560 cells/uL (ref 200–950)
NEUTROS ABS: 4340 {cells}/uL (ref 1500–7800)
Neutrophils Relative %: 62 %
PLATELETS: 317 10*3/uL (ref 140–400)
RBC: 4.45 MIL/uL (ref 3.80–5.10)
RDW: 14.3 % (ref 11.0–15.0)
WBC: 7 10*3/uL (ref 3.8–10.8)

## 2016-06-18 NOTE — Progress Notes (Signed)
   Subjective:    [name], female    DOB: 1960/09/04, 55 y.o.   MRN: [id number]  HPI Here today for f/u. Hx of UC (new diagnose in January of this year). (wt 165 in march). Presently taking Apriso 3766m four times a day. At last OV she was doing well. No rectal bleeding.  In March she was started on Prednisone 374mx 1 week and reducing by 68m10meekly for rectal bleeding.  She tells me she is doing fine. She has no rectal bleeding. There is no abdominal or rectal pain. Appetite is good. No weight loss. No dysphagia.   09/04/2015: Colonoscopy  Indications: Patient is 55 39ar old Caucasian female who presents with one month history of bloody diarrhea as well as lower abdominal and rectal pain. H&H 2 weeks ago was 15 and 44.0.  Impression:  Normal mucosa of terminal ileum. Acute colitis involving the rectal mucosa and distal half of sigmoid colon with transition zone at 37 cm from the anal margin. Endoscopic appearance typical of ulcerative colitis. Biopsies taken from mucosa of sigmoid colon and rectum.  Biopsy results reviewed with patient. It shows ulcerative colitis. Patient will continue mesalamine and emesis until prescription runs out. Will start her on Apriso 1500 mg po qd.   Review of Systems     Objective:   Physical Exam Blood pressure (!) 132/58, pulse 64, temperature 97.7 F (36.5 C), height 5' 4"  (1.626 m), weight 166 lb 4.8 oz (75.4 kg), last menstrual period 02/01/2011.  Alert and oriented. Skin warm and dry. Oral mucosa is moist.   . Sclera anicteric, conjunctivae is pink. Thyroid not enlarged. No cervical lymphadenopathy. Lungs clear. Heart regular rate and rhythm.  Abdomen is soft. Bowel sounds are positive. No hepatomegaly. No abdominal masses felt. No tenderness.  No edema to lower extremities.  .       Assessment & Plan:  UC. She seems to be in remission.  Will get a sedrate and CBC. OV in 6 months.

## 2016-06-18 NOTE — Patient Instructions (Signed)
CBC and sedrate. OV in 6 months.

## 2016-06-19 LAB — SEDIMENTATION RATE: SED RATE: 5 mm/h (ref 0–30)

## 2016-06-25 ENCOUNTER — Ambulatory Visit (INDEPENDENT_AMBULATORY_CARE_PROVIDER_SITE_OTHER): Payer: Self-pay | Admitting: Internal Medicine

## 2016-06-30 MED FILL — APRISO ER 0.375 G CAPSULE: 0.375 | 30 days supply | Qty: 120 | Fill #2

## 2016-07-01 ENCOUNTER — Other Ambulatory Visit: Payer: Self-pay | Admitting: Family Medicine

## 2016-07-01 DIAGNOSIS — Z1231 Encounter for screening mammogram for malignant neoplasm of breast: Secondary | ICD-10-CM

## 2016-07-13 ENCOUNTER — Ambulatory Visit (HOSPITAL_COMMUNITY): Payer: Self-pay

## 2016-07-20 ENCOUNTER — Ambulatory Visit (HOSPITAL_COMMUNITY)
Admission: RE | Admit: 2016-07-20 | Discharge: 2016-07-20 | Disposition: A | Discharge status: Home / Self Care | Payer: 59 | Source: Ambulatory Visit | Attending: Family Medicine | Admitting: Family Medicine

## 2016-07-20 DIAGNOSIS — Z1231 Encounter for screening mammogram for malignant neoplasm of breast: Secondary | ICD-10-CM | POA: Diagnosis not present

## 2016-08-10 MED FILL — APRISO ER 0.375 G CAPSULE: 0.375 | 30 days supply | Qty: 120 | Fill #3

## 2016-09-21 MED FILL — APRISO ER 0.375 G CAPSULE: 0.375 | 30 days supply | Qty: 120 | Fill #4

## 2016-11-03 MED FILL — APRISO ER 0.375 G CAPSULE: 0.375 | 30 days supply | Qty: 120 | Fill #5

## 2016-12-15 MED FILL — APRISO ER 0.375 G CAPSULE: 0.375 | 30 days supply | Qty: 120 | Fill #6

## 2016-12-17 ENCOUNTER — Encounter (INDEPENDENT_AMBULATORY_CARE_PROVIDER_SITE_OTHER): Payer: Self-pay | Admitting: Internal Medicine

## 2016-12-17 ENCOUNTER — Ambulatory Visit (INDEPENDENT_AMBULATORY_CARE_PROVIDER_SITE_OTHER): Payer: 59 | Admitting: Internal Medicine

## 2016-12-17 ENCOUNTER — Encounter (INDEPENDENT_AMBULATORY_CARE_PROVIDER_SITE_OTHER): Payer: Self-pay

## 2016-12-17 VITALS — BP 150/80 | HR 60 | Temp 98.0°F | Ht 64.0 in | Wt 168.8 lb

## 2016-12-17 DIAGNOSIS — K5120 Ulcerative (chronic) proctitis without complications: Secondary | ICD-10-CM

## 2016-12-17 NOTE — Progress Notes (Signed)
   Subjective:    [name], female    DOB: June 14, 1961, 56 y.o.   MRN: [id number]  HPI Here today for f/u. She was last seen in October of 2017. Hx of UC. Diagnosed in 2017. Wt in October 166. She tells me she is doing excellent. No rectal bleeding. She is having x 1 stool a day. Appetite is good. No weight loss.  Very little exercise. Maintained on Apriso 331m QID.     09/04/2015: Colonoscopy  Indications:Patient is 56year old Caucasian female who presents with one month history of bloody diarrhea as well as lower abdominal and rectal pain. H&H 2 weeks ago was 15 and 44.0.  Impression:  Normal mucosa of terminal ileum. Acute colitis involving the rectal mucosa and distal half of sigmoid colon with transition zone at 37 cm from the anal margin. Endoscopic appearance typical of ulcerative colitis. Biopsies taken from mucosa of sigmoid colon and rectum.  Biopsy resultsreviewed with patient. It shows ulcerative colitis. Patient will continue mesalamine and emesis until prescription runs out. Will start her on Apriso 1500 mg po qd.  CBC    Component Value Date/Time   WBC 7.0 06/18/2016 1508   RBC 4.45 06/18/2016 1508   HGB 11.9 06/18/2016 1508   HCT 37.2 06/18/2016 1508   PLT 317 06/18/2016 1508   MCV 83.6 06/18/2016 1508   MCH 26.7 (L) 06/18/2016 1508   MCHC 32.0 06/18/2016 1508   RDW 14.3 06/18/2016 1508   LYMPHSABS 1,470 06/18/2016 1508   MONOABS 560 06/18/2016 1508   EOSABS 630 (H) 06/18/2016 1508   BASOSABS 0 06/18/2016 1508    06/18/2016 CRP 6.    Review of Systems Past Medical History:  Diagnosis Date  . Asthma   . Rectal bleeding   . Ulcerative colitis ([facility]     Past Surgical History:  Procedure Laterality Date  . COLONOSCOPY N/A 09/04/2015   Procedure: COLONOSCOPY;  Surgeon: NRogene Houston MD;  [facility];  Service: Endoscopy;  Laterality: N/A;  1:15 - moved to 12:15 - Ann to notify  . TUBAL LIGATION     1992    . WRIST SURGERY     rt wrist surgery x 2 for a varicose vein    No Known Allergies  Current Outpatient Prescriptions on File Prior to Visit  Medication Sig Dispense Refill  . mesalamine (APRISO) 0.375 g 24 hr capsule Take 1 capsule (0.375 g total) by mouth 4 (four) times daily. 120 capsule 6   No current facility-administered medications on file prior to visit.        Objective:   Physical Exam Blood pressure (!) 150/80, pulse 60, temperature 98 F (36.7 C), height 5' 4"  (1.626 m), weight 168 lb 12.8 oz (76.6 kg), last menstrual period 02/01/2011. Alert and oriented. Skin warm and dry. Oral mucosa is moist.   . Sclera anicteric, conjunctivae is pink. Thyroid not enlarged. No cervical lymphadenopathy. Lungs clear. Heart regular rate and rhythm.  Abdomen is soft. Bowel sounds are positive. No hepatomegaly. No abdominal masses felt. No tenderness.  No edema to lower extremities.          Assessment & Plan:  UC. She is doing well. Having one BM a day.  Will see back in 6 months.

## 2016-12-17 NOTE — Patient Instructions (Signed)
OV in 6 months.

## 2017-02-01 DIAGNOSIS — H524 Presbyopia: Secondary | ICD-10-CM | POA: Diagnosis not present

## 2017-02-01 DIAGNOSIS — H52223 Regular astigmatism, bilateral: Secondary | ICD-10-CM | POA: Diagnosis not present

## 2017-02-01 DIAGNOSIS — H5203 Hypermetropia, bilateral: Secondary | ICD-10-CM | POA: Diagnosis not present

## 2017-02-04 ENCOUNTER — Other Ambulatory Visit (INDEPENDENT_AMBULATORY_CARE_PROVIDER_SITE_OTHER): Payer: Self-pay | Admitting: Internal Medicine

## 2017-02-04 DIAGNOSIS — K5120 Ulcerative (chronic) proctitis without complications: Secondary | ICD-10-CM

## 2017-02-04 MED ORDER — MESALAMINE ER 0.375 G PO CP24: 375.0000 mg | ORAL_CAPSULE | Freq: Four times a day (QID) | ORAL | 6 refills | Status: DC

## 2017-02-04 MED FILL — APRISO ER 0.375 G CAPSULE: 0.375 | 30 days supply | Qty: 120 | Fill #0

## 2017-02-15 ENCOUNTER — Ambulatory Visit (INDEPENDENT_AMBULATORY_CARE_PROVIDER_SITE_OTHER): Payer: 59 | Admitting: Family Medicine

## 2017-02-15 ENCOUNTER — Encounter: Payer: Self-pay | Admitting: Family Medicine

## 2017-02-15 VITALS — BP 132/64 | HR 86 | Temp 98.1°F | Resp 14 | Ht 64.0 in | Wt 164.0 lb

## 2017-02-15 DIAGNOSIS — R21 Rash and other nonspecific skin eruption: Secondary | ICD-10-CM

## 2017-02-15 MED ORDER — TRIAMCINOLONE ACETONIDE 0.1 % EX CREA: 1.0000 "application " | TOPICAL_CREAM | Freq: Two times a day (BID) | CUTANEOUS | 0 refills | Status: DC

## 2017-02-15 MED ORDER — CEPHALEXIN 500 MG PO CAPS: 500.0000 mg | ORAL_CAPSULE | Freq: Two times a day (BID) | ORAL | 0 refills | Status: DC

## 2017-02-15 NOTE — Patient Instructions (Addendum)
Take antibiotics and use steroid cream  F/U as needed

## 2017-02-15 NOTE — Progress Notes (Signed)
   Subjective:    [name], female    DOB: 1960/10/11, 56 y.o.   MRN: [id number]  Patient presents for Rash (x2 weeks- open irritated area in fold of R side of neck- has tried benadryl cream, ABTx with no relief- reports itching and tenderness to touch) Patient here with a rash to her right neck at the fold. She states it initially was a red spine she had been outside she's not sure if she was bitten by something or came in contact with something. Was very itchy so she started using Benadryl cream it started to spread therefore she started putting antibiotic ointment on it this was 2 weeks ago. Now she is getting some oozing from the rash and is still very itchy. She does not remember if there were any blisters that she has had shingles before. She's not had any fever. She had a mild cold a few weeks ago otherwise has felt fine. She is not had any sick contacts.    Review Of Systems:  GEN- denies fatigue, fever, weight loss,weakness, recent illness HEENT- denies eye drainage, change in vision, nasal discharge, CVS- denies chest pain, palpitations RESP- denies SOB, cough, wheeze ABD- denies N/V, change in stools, abd pain GU- denies dysuria, hematuria, dribbling, incontinence MSK- denies joint pain, muscle aches, injury Neuro- denies headache, dizziness, syncope, seizure activity       Objective:    BP 132/64   Pulse 86   Temp 98.1 F (36.7 C) (Oral)   Resp 14   Ht 5' 4"  (1.626 m)   Wt 164 lb (74.4 kg)   LMP 02/01/2011   SpO2 99%   BMI 28.15 kg/m  GEN- NAD, alert and oriented x3 HEENT- PERRL, EOMI, non injected sclera, pink conjunctiva, MMM, oropharynx clear Neck- Supple, no thyromegaly CVS- RRR, no murmur RESP-CTAB Skin- right neck 2.5 x 1.5" area of erythema, few tiny blister with  Yellow crusting and oozing at center, hair mixed into rash, NT, mild erythema extending linear toward jaw , no discrete abscess        Assessment & Plan:      Problem  List Items Addressed This Visit    None    Visit Diagnoses    Rash of neck    -  Primary   ? if initally insect bite, posion sumac/ivy dermatitis, does not appear to be shingles based on apperance today, but has some superinfection. Given keflex, Triamcinolone cream If worsening despite this treatment would send to dermatology      Note: This dictation was prepared with Dragon dictation along with smaller phrase technology. Any transcriptional errors that result from this process are unintentional.

## 2017-03-29 MED FILL — APRISO ER 0.375 G CAPSULE: 0.375 | 30 days supply | Qty: 120 | Fill #1

## 2017-05-17 MED FILL — APRISO ER 0.375 G CAPSULE: 0.375 | 30 days supply | Qty: 120 | Fill #2

## 2017-06-21 ENCOUNTER — Ambulatory Visit (INDEPENDENT_AMBULATORY_CARE_PROVIDER_SITE_OTHER): Payer: Self-pay | Admitting: Internal Medicine

## 2017-06-22 ENCOUNTER — Encounter (INDEPENDENT_AMBULATORY_CARE_PROVIDER_SITE_OTHER): Payer: Self-pay

## 2017-06-22 ENCOUNTER — Encounter (INDEPENDENT_AMBULATORY_CARE_PROVIDER_SITE_OTHER): Payer: Self-pay | Admitting: Internal Medicine

## 2017-06-22 ENCOUNTER — Ambulatory Visit (INDEPENDENT_AMBULATORY_CARE_PROVIDER_SITE_OTHER): Payer: 59 | Admitting: Internal Medicine

## 2017-06-22 VITALS — BP 180/80 | HR 76 | Temp 97.7°F | Ht 64.0 in | Wt 170.6 lb

## 2017-06-22 DIAGNOSIS — K5120 Ulcerative (chronic) proctitis without complications: Secondary | ICD-10-CM

## 2017-06-22 NOTE — Patient Instructions (Signed)
CBC and CRP. OV in 6 months

## 2017-06-22 NOTE — Progress Notes (Signed)
Subjective:    [name], female    DOB: [birth year], 56 y.o.   MRN: [id number]  HPI Here today for f/u. Last seen in April of this year. Hx of UC which was diagnosed in 2017.  Wt in April 168. Maintained on Apriso 378m four tabs a day. CBC and sedrate normal in October of 2017. She is doing good. Having a BM x 1 a day. No melena or BRRB. Not exercising. Appetite is good. She has gained 2 pounds since her lat visit in April.  [place] full time [place] [employer] [place]   Erythrocyte Sedimentation Rate     Component Value Date/Time   ESRSEDRATE 5 06/18/2016 1508    CBC    Component Value Date/Time   WBC 7.0 06/18/2016 1508   RBC 4.45 06/18/2016 1508   HGB 11.9 06/18/2016 1508   HCT 37.2 06/18/2016 1508   PLT 317 06/18/2016 1508   MCV 83.6 06/18/2016 1508   MCH 26.7 (L) 06/18/2016 1508   MCHC 32.0 06/18/2016 1508   RDW 14.3 06/18/2016 1508   LYMPHSABS 1,470 06/18/2016 1508   MONOABS 560 06/18/2016 1508   EOSABS 630 (H) 06/18/2016 1508   BASOSABS 0 06/18/2016 1508     09/04/2015: Colonoscopy  Indications:Patient is 56year old Caucasian female who presents with one month history of bloody diarrhea as well as lower abdominal and rectal pain. H&H 2 weeks ago was 15 and 44.0.  Impression:  Normal mucosa of terminal ileum. Acute colitis involving the rectal mucosa and distal half of sigmoid colon with transition zone at 37 cm from the anal margin. Endoscopic appearance typical of ulcerative colitis. Biopsies taken from mucosa of sigmoid colon and rectum.  Biopsy resultsreviewed with patient. It shows ulcerative colitis. Patient will continue mesalamine and emesis until prescription runs out. Will start her on Apriso 1500 mg po qd.   Review of Systems Past Medical History:  Diagnosis Date  . Asthma   . Rectal bleeding   . Ulcerative colitis ([facility]     Past Surgical History:  Procedure Laterality Date  . COLONOSCOPY N/A 09/04/2015   Procedure:  COLONOSCOPY;  Surgeon: NRogene Houston MD;  [facility];  Service: Endoscopy;  Laterality: N/A;  1:15 - moved to 12:15 - Ann to notify  . TUBAL LIGATION     1992  . WRIST SURGERY     rt wrist surgery x 2 for a varicose vein    No Known Allergies  Current Outpatient Prescriptions on File Prior to Visit  Medication Sig Dispense Refill  . mesalamine (APRISO) 0.375 g 24 hr capsule Take 1 capsule (0.375 g total) by mouth 4 (four) times daily. 120 capsule 6  . triamcinolone cream (KENALOG) 0.1 % Apply 1 application topically 2 (two) times daily. 30 g 0   No current facility-administered medications on file prior to visit.         Objective:   Physical Exam Blood pressure (!) 180/80, pulse 76, temperature 97.7 F (36.5 C), height 5' 4"  (1.626 m), weight 170 lb 9.6 oz (77.4 kg), last menstrual period 02/01/2011. Alert and oriented. Skin warm and dry. Oral mucosa is moist.   . Sclera anicteric, conjunctivae is pink. Thyroid not enlarged. No cervical lymphadenopathy. Lungs clear. Heart regular rate and rhythm.  Abdomen is soft. Bowel sounds are positive. No hepatomegaly. No abdominal masses felt. No tenderness.  No edema to lower extremities.           Assessment & Plan:  UC. She is doing well. Will get a CBC and CRP. OV in 6 months.

## 2017-06-23 LAB — CBC WITH DIFFERENTIAL/PLATELET
Basophils Absolute: 28 cells/uL (ref 0–200)
Basophils Relative: 0.6 %
Eosinophils Absolute: 239 cells/uL (ref 15–500)
Eosinophils Relative: 5.2 %
HEMATOCRIT: 40.2 % (ref 35.0–45.0)
Hemoglobin: 13.4 g/dL (ref 11.7–15.5)
LYMPHS ABS: 1463 {cells}/uL (ref 850–3900)
MCH: 28.8 pg (ref 27.0–33.0)
MCHC: 33.3 g/dL (ref 32.0–36.0)
MCV: 86.3 fL (ref 80.0–100.0)
MPV: 10.2 fL (ref 7.5–12.5)
Monocytes Relative: 12.1 %
NEUTROS PCT: 50.3 %
Neutro Abs: 2314 cells/uL (ref 1500–7800)
Platelets: 285 10*3/uL (ref 140–400)
RBC: 4.66 10*6/uL (ref 3.80–5.10)
RDW: 12.7 % (ref 11.0–15.0)
Total Lymphocyte: 31.8 %
WBC: 4.6 10*3/uL (ref 3.8–10.8)
WBCMIX: 557 {cells}/uL (ref 200–950)

## 2017-06-23 LAB — C-REACTIVE PROTEIN: CRP: 2 mg/L (ref <8.0)

## 2017-07-06 MED FILL — APRISO ER 0.375 G CAPSULE: 0.375 | 30 days supply | Qty: 120 | Fill #3

## 2017-08-03 MED FILL — HYDROCODON-APAP 10-325: 10-325 | 2 days supply | Qty: 12 | Fill #0

## 2017-08-03 MED FILL — AMOXICILLIN 500 MG CAPSULE: 500 | 5 days supply | Qty: 15 | Fill #0

## 2017-09-01 MED FILL — APRISO ER 0.375 G CAPSULE: 0.375 | 30 days supply | Qty: 120 | Fill #4

## 2017-09-02 ENCOUNTER — Other Ambulatory Visit: Payer: Self-pay | Admitting: Family Medicine

## 2017-10-27 MED FILL — APRISO ER 0.375 G CAPSULE: 0.375 | 30 days supply | Qty: 120 | Fill #5

## 2017-12-20 MED FILL — APRISO ER 0.375 G CAPSULE: 0.375 | 30 days supply | Qty: 120 | Fill #6

## 2018-02-17 ENCOUNTER — Other Ambulatory Visit (INDEPENDENT_AMBULATORY_CARE_PROVIDER_SITE_OTHER): Payer: Self-pay | Admitting: Internal Medicine

## 2018-02-17 DIAGNOSIS — K5120 Ulcerative (chronic) proctitis without complications: Secondary | ICD-10-CM

## 2018-02-17 MED FILL — APRISO ER 0.375 G CAPSULE: 0.375 | 30 days supply | Qty: 120 | Fill #0

## 2018-04-19 MED FILL — APRISO ER 0.375 G CAPSULE: 0.375 | 30 days supply | Qty: 120 | Fill #1

## 2018-06-22 ENCOUNTER — Ambulatory Visit (INDEPENDENT_AMBULATORY_CARE_PROVIDER_SITE_OTHER): Payer: Self-pay | Admitting: Internal Medicine

## 2018-06-30 ENCOUNTER — Ambulatory Visit (INDEPENDENT_AMBULATORY_CARE_PROVIDER_SITE_OTHER): Payer: Self-pay | Admitting: Internal Medicine

## 2018-07-04 ENCOUNTER — Ambulatory Visit (INDEPENDENT_AMBULATORY_CARE_PROVIDER_SITE_OTHER): Payer: 59 | Admitting: Internal Medicine

## 2018-07-04 ENCOUNTER — Encounter (INDEPENDENT_AMBULATORY_CARE_PROVIDER_SITE_OTHER): Payer: Self-pay | Admitting: Internal Medicine

## 2018-07-04 VITALS — BP 140/70 | HR 68 | Temp 98.1°F | Ht 64.0 in | Wt 170.2 lb

## 2018-07-04 DIAGNOSIS — K5120 Ulcerative (chronic) proctitis without complications: Secondary | ICD-10-CM

## 2018-07-04 LAB — SEDIMENTATION RATE: SED RATE: 6 mm/h (ref 0–30)

## 2018-07-04 LAB — CBC WITH DIFFERENTIAL/PLATELET
Basophils Absolute: 39 cells/uL (ref 0–200)
Basophils Relative: 0.8 %
EOS PCT: 3.7 %
Eosinophils Absolute: 181 cells/uL (ref 15–500)
HCT: 42.9 % (ref 35.0–45.0)
HEMOGLOBIN: 14.2 g/dL (ref 11.7–15.5)
Lymphs Abs: 1441 cells/uL (ref 850–3900)
MCH: 29.3 pg (ref 27.0–33.0)
MCHC: 33.1 g/dL (ref 32.0–36.0)
MCV: 88.5 fL (ref 80.0–100.0)
MONOS PCT: 9.4 %
MPV: 10 fL (ref 7.5–12.5)
NEUTROS ABS: 2778 {cells}/uL (ref 1500–7800)
Neutrophils Relative %: 56.7 %
PLATELETS: 309 10*3/uL (ref 140–400)
RBC: 4.85 10*6/uL (ref 3.80–5.10)
RDW: 12.4 % (ref 11.0–15.0)
TOTAL LYMPHOCYTE: 29.4 %
WBC mixed population: 461 cells/uL (ref 200–950)
WBC: 4.9 10*3/uL (ref 3.8–10.8)

## 2018-07-04 NOTE — Progress Notes (Signed)
   Subjective:    [name], female    DOB: 14-Sep-1960, 57 y.o.   MRN: [id number]  HPI Here today for f/u.  Last seen in April of this year. Hx of UC. She was last seen in April of this year and was doing well. Her last colonoscopy was in 2017.She tells me she is doing well. She is working full time. Her appetite is good. No weight.. Having a BM x 1 a day. No melena or BRRB.  She is    exercising  but not every day.          CBC    Component Value Date/Time   WBC 4.6 06/22/2017 0918   RBC 4.66 06/22/2017 0918   HGB 13.4 06/22/2017 0918   HCT 40.2 06/22/2017 0918   PLT 285 06/22/2017 0918   MCV 86.3 06/22/2017 0918   MCH 28.8 06/22/2017 0918   MCHC 33.3 06/22/2017 0918   RDW 12.7 06/22/2017 0918   LYMPHSABS 1,463 06/22/2017 0918   MONOABS 560 06/18/2016 1508   EOSABS 239 06/22/2017 0918   BASOSABS 28 06/22/2017 0918       09/04/2015: Colonoscopy  Indications:Patient is 57 year old Caucasian female who presents with one month history of bloody diarrhea as well as lower abdominal and rectal pain. H&H 2 weeks ago was 15 and 44.0.  Impression:  Normal mucosa of terminal ileum. Acute colitis involving the rectal mucosa and distal half of sigmoid colon with transition zone at 37 cm from the anal margin. Endoscopic appearance typical of ulcerative colitis. Biopsies taken from mucosa of sigmoid colon and rectum.  Biopsy resultsreviewed with patient. It shows ulcerative colitis. Patient will continue mesalamine and emesis until prescription runs out. Will start her on Apriso 1500 mg po qd. Review of Systems     Objective:   Physical Exam Blood pressure 140/70, pulse 68, temperature 98.1 F (36.7 C), height 5' 4"  (1.626 m), weight 170 lb 3.2 oz (77.2 kg), last menstrual period 02/01/2011. Alert and oriented. Skin warm and dry. Oral mucosa is moist.   . Sclera anicteric, conjunctivae is pink. Thyroid not enlarged. No cervical lymphadenopathy. Lungs  clear. Heart regular rate and rhythm.  Abdomen is soft. Bowel sounds are positive. No hepatomegaly. No abdominal masses felt. No tenderness.  No edema to lower extremities.          Assessment & Plan:  UC. She is doing well. Will get a CBC and sedrate. OV in 1 year.

## 2018-07-04 NOTE — Patient Instructions (Signed)
Continue present medications. OV in 1 year. Labs today.

## 2018-07-11 MED FILL — APRISO ER 0.375 G CAPSULE: 0.375 | 30 days supply | Qty: 120 | Fill #2

## 2018-09-21 MED FILL — APRISO ER 0.375 G CAPSULE: 0.375 | 30 days supply | Qty: 120 | Fill #3

## 2018-11-08 MED FILL — MESALAMINE ER 0.375 GM CP24: 0.375 | 30 days supply | Qty: 120 | Fill #4 | Status: TO

## 2019-01-13 ENCOUNTER — Encounter: Payer: 59 | Admitting: Family Medicine

## 2019-01-25 MED FILL — MESALAMINE ER 0.375 GM CP24: 0.375 | 30 days supply | Qty: 120 | Fill #0

## 2019-02-17 MED FILL — MESALAMINE ER 0.375 GM CP24: 0.375 | 30 days supply | Qty: 120 | Fill #0

## 2019-02-20 MED FILL — MESALAMINE ER 0.375 GM CP24: 0.375 | 30 days supply | Qty: 120 | Fill #0

## 2019-03-28 ENCOUNTER — Other Ambulatory Visit: Payer: Self-pay

## 2019-03-29 ENCOUNTER — Ambulatory Visit (INDEPENDENT_AMBULATORY_CARE_PROVIDER_SITE_OTHER): Payer: 59 | Admitting: Family Medicine

## 2019-03-29 ENCOUNTER — Encounter: Payer: Self-pay | Admitting: Family Medicine

## 2019-03-29 VITALS — BP 124/76 | HR 74 | Temp 98.2°F | Resp 14 | Ht 64.0 in | Wt 172.0 lb

## 2019-03-29 DIAGNOSIS — Z01411 Encounter for gynecological examination (general) (routine) with abnormal findings: Secondary | ICD-10-CM | POA: Diagnosis not present

## 2019-03-29 DIAGNOSIS — Z1239 Encounter for other screening for malignant neoplasm of breast: Secondary | ICD-10-CM | POA: Diagnosis not present

## 2019-03-29 DIAGNOSIS — Z124 Encounter for screening for malignant neoplasm of cervix: Secondary | ICD-10-CM

## 2019-03-29 DIAGNOSIS — Z23 Encounter for immunization: Secondary | ICD-10-CM | POA: Diagnosis not present

## 2019-03-29 DIAGNOSIS — D229 Melanocytic nevi, unspecified: Secondary | ICD-10-CM | POA: Diagnosis not present

## 2019-03-29 DIAGNOSIS — K51011 Ulcerative (chronic) pancolitis with rectal bleeding: Secondary | ICD-10-CM

## 2019-03-29 DIAGNOSIS — Z1159 Encounter for screening for other viral diseases: Secondary | ICD-10-CM

## 2019-03-29 DIAGNOSIS — Z0001 Encounter for general adult medical examination with abnormal findings: Secondary | ICD-10-CM | POA: Diagnosis not present

## 2019-03-29 DIAGNOSIS — Z0000 Encounter for general adult medical examination without abnormal findings: Secondary | ICD-10-CM | POA: Diagnosis not present

## 2019-03-29 MED ORDER — SHINGRIX 50 MCG/0.5ML IM SUSR: 0.5000 mL | Freq: Once | INTRAMUSCULAR | 1 refills | Status: AC

## 2019-03-29 MED FILL — SHINGRIX 50 MCG SUS: 50 | 1 days supply | Qty: 1 | Fill #0

## 2019-03-29 NOTE — Addendum Note (Signed)
Addended by: Sheral Flow on: 03/29/2019 11:44 AM   Modules accepted: Orders

## 2019-03-29 NOTE — Assessment & Plan Note (Addendum)
Multiple nevi, one on back looks benign, can continue to monitor for any changes such as color or bleeding She does not want anything done with her moles I did offer dermatology referral pt declines today

## 2019-03-29 NOTE — Progress Notes (Signed)
   Subjective:    [name], female    DOB: [birth year], 58 y.o.   MRN: [id number]  Patient presents for Gynecologic Exam (is fasting) Pt here for CPE, and due for PAP Smear Followed by GI for UC on Apriso - Dr. Laural Golden Due for Mammogram  Her last visit was 2 years ago with me   Colonoscopy- UTD  Eye Doctor- Dr. Jorja Loa  Last PAP 2017 - no vaginal bleeding  Due for TDAP, discussed shingrix   History reviewed   Has multiple nevi, has one on upper back, has not changed in 30 years, wants me to look at   History of asthma not exacerbations in about 10 years now   Review Of Systems:  GEN- denies fatigue, fever, weight loss,weakness, recent illness HEENT- denies eye drainage, change in vision, nasal discharge, CVS- denies chest pain, palpitations RESP- denies SOB, cough, wheeze ABD- denies N/V, change in stools, abd pain GU- denies dysuria, hematuria, dribbling, incontinence MSK- denies joint pain, muscle aches, injury Neuro- denies headache, dizziness, syncope, seizure activity       Objective:    BP 124/76   Pulse 74   Temp 98.2 F (36.8 C) (Oral)   Resp 14   Ht 5' 4"  (1.626 m)   Wt 172 lb (78 kg)   LMP 02/01/2011   SpO2 97%   BMI 29.52 kg/m  GEN- NAD, alert and oriented x3 HEENT- PERRL, EOMI, non injected sclera, pink conjunctiva, MMM, oropharynx clear Neck- Supple, no thyromegaly Breast- normal symmetry, no nipple inversion,no nipple drainage, no nodules or lumps felt Nodes- no axillary nodes CVS- RRR, no murmur RESP-CTAB ABD-NABS,soft,NT,ND GU- normal external genitalia, vaginal mucosa pink and moist, cervix visualized no growth, no blood form os, minimal thin clear discharge, no CMT, no ovarian masses, uterus normal size Skin multiple nevi, some cherry angiomas, larger nickle size nevus raised, NT on left upper back  EXT- No edema Pulses- Radial, DP- 2+   Audit C/FALL/Depression screen neg      Assessment & Plan:      Problem  List Items Addressed This Visit      Unprioritized   Multiple nevi    Multiple nevi, one on back looks benign, can continue to monitor for any changes such as color or bleeding She does not want anything done with her moles I did offer dermatology referral pt declines today       UC (ulcerative colitis) ([place])    Medication per GI       Other Visit Diagnoses    Routine general medical examination at a health care facility    -  Primary   CPE done, pt to schedule mammogram, PAP Done, TDAP given, shingrix to pharmacy    Relevant Orders   CBC with Differential/Platelet   Comprehensive metabolic panel   Lipid panel   HIV Antibody (routine testing w rflx)   Hepatitis C antibody   Cervical cancer screening       Relevant Orders   Pap IG w/ reflex to HPV when ASC-U   Need for hepatitis C screening test       Relevant Orders   Hepatitis C antibody   Breast cancer screening       Relevant Orders   MM 3D SCREEN BREAST BILATERAL      Note: This dictation was prepared with Dragon dictation along with smaller phrase technology. Any transcriptional errors that result from this process are unintentional.

## 2019-03-29 NOTE — Assessment & Plan Note (Signed)
Medication per GI

## 2019-03-29 NOTE — Patient Instructions (Addendum)
I recommend eye visit once a year I recommend dental visit every 6 months Goal is to  Exercise 30 minutes 5 days a week We will call with lab results  Schedule your mammogram Shingrix sent to pharmacy  F/U 1 year for physical

## 2019-03-30 LAB — COMPREHENSIVE METABOLIC PANEL
AG Ratio: 1.5 (calc) (ref 1.0–2.5)
ALT: 13 U/L (ref 6–29)
AST: 14 U/L (ref 10–35)
Albumin: 4.4 g/dL (ref 3.6–5.1)
Alkaline phosphatase (APISO): 71 U/L (ref 37–153)
BUN: 11 mg/dL (ref 7–25)
CO2: 25 mmol/L (ref 20–32)
Calcium: 9.8 mg/dL (ref 8.6–10.4)
Chloride: 102 mmol/L (ref 98–110)
Creat: 0.87 mg/dL (ref 0.50–1.05)
Globulin: 2.9 g/dL (calc) (ref 1.9–3.7)
Glucose, Bld: 95 mg/dL (ref 65–99)
Potassium: 4.3 mmol/L (ref 3.5–5.3)
Sodium: 138 mmol/L (ref 135–146)
Total Bilirubin: 0.5 mg/dL (ref 0.2–1.2)
Total Protein: 7.3 g/dL (ref 6.1–8.1)

## 2019-03-30 LAB — HEPATITIS C ANTIBODY
Hepatitis C Ab: NONREACTIVE
SIGNAL TO CUT-OFF: 0.03 (ref <1.00)

## 2019-03-30 LAB — PAP IG W/ RFLX HPV ASCU

## 2019-03-30 LAB — LIPID PANEL
Cholesterol: 267 mg/dL — ABNORMAL HIGH (ref <200)
HDL: 56 mg/dL (ref >=50)
LDL Cholesterol (Calc): 187 mg/dL (calc) — ABNORMAL HIGH
Non-HDL Cholesterol (Calc): 211 mg/dL (calc) — ABNORMAL HIGH (ref <130)
Total CHOL/HDL Ratio: 4.8 (calc) (ref <5.0)
Triglycerides: 116 mg/dL (ref <150)

## 2019-03-30 LAB — HIV ANTIBODY (ROUTINE TESTING W REFLEX): HIV 1&2 Ab, 4th Generation: NONREACTIVE

## 2019-03-30 LAB — CBC WITH DIFFERENTIAL/PLATELET
Absolute Monocytes: 496 {cells}/uL (ref 200–950)
Basophils Absolute: 17 {cells}/uL (ref 0–200)
Basophils Relative: 0.3 %
Eosinophils Absolute: 160 {cells}/uL (ref 15–500)
Eosinophils Relative: 2.8 %
HCT: 44.9 % (ref 35.0–45.0)
Hemoglobin: 14.8 g/dL (ref 11.7–15.5)
Lymphs Abs: 1511 {cells}/uL (ref 850–3900)
MCH: 29.5 pg (ref 27.0–33.0)
MCHC: 33 g/dL (ref 32.0–36.0)
MCV: 89.6 fL (ref 80.0–100.0)
MPV: 10.1 fL (ref 7.5–12.5)
Monocytes Relative: 8.7 %
Neutro Abs: 3517 {cells}/uL (ref 1500–7800)
Neutrophils Relative %: 61.7 %
Platelets: 302 Thousand/uL (ref 140–400)
RBC: 5.01 Million/uL (ref 3.80–5.10)
RDW: 12.8 % (ref 11.0–15.0)
Total Lymphocyte: 26.5 %
WBC: 5.7 Thousand/uL (ref 3.8–10.8)

## 2019-03-31 ENCOUNTER — Encounter: Payer: Self-pay | Admitting: Family Medicine

## 2019-03-31 DIAGNOSIS — E785 Hyperlipidemia, unspecified: Secondary | ICD-10-CM | POA: Insufficient documentation

## 2019-04-04 ENCOUNTER — Other Ambulatory Visit: Payer: Self-pay | Admitting: *Deleted

## 2019-04-04 DIAGNOSIS — E785 Hyperlipidemia, unspecified: Secondary | ICD-10-CM

## 2019-04-24 ENCOUNTER — Ambulatory Visit (HOSPITAL_COMMUNITY)
Admission: RE | Admit: 2019-04-24 | Discharge: 2019-04-24 | Disposition: A | Discharge status: Home / Self Care | Payer: 59 | Source: Ambulatory Visit | Attending: Family Medicine | Admitting: Family Medicine

## 2019-04-24 ENCOUNTER — Other Ambulatory Visit: Payer: Self-pay

## 2019-04-24 DIAGNOSIS — Z1231 Encounter for screening mammogram for malignant neoplasm of breast: Secondary | ICD-10-CM | POA: Diagnosis not present

## 2019-04-24 DIAGNOSIS — Z1239 Encounter for other screening for malignant neoplasm of breast: Secondary | ICD-10-CM

## 2019-04-25 ENCOUNTER — Other Ambulatory Visit (HOSPITAL_COMMUNITY): Payer: Self-pay | Admitting: Family Medicine

## 2019-04-25 DIAGNOSIS — R928 Other abnormal and inconclusive findings on diagnostic imaging of breast: Secondary | ICD-10-CM

## 2019-05-09 ENCOUNTER — Other Ambulatory Visit: Payer: Self-pay

## 2019-05-09 ENCOUNTER — Ambulatory Visit (HOSPITAL_COMMUNITY)
Admission: RE | Admit: 2019-05-09 | Discharge: 2019-05-09 | Disposition: A | Discharge status: Home / Self Care | Payer: 59 | Source: Ambulatory Visit | Attending: Family Medicine | Admitting: Family Medicine

## 2019-05-09 ENCOUNTER — Ambulatory Visit (HOSPITAL_COMMUNITY): Admission: RE | Admit: 2019-05-09 | Payer: 59 | Source: Ambulatory Visit

## 2019-05-09 DIAGNOSIS — R928 Other abnormal and inconclusive findings on diagnostic imaging of breast: Secondary | ICD-10-CM | POA: Insufficient documentation

## 2019-05-17 MED FILL — MESALAMINE ER 0.375 GM CP24: 0.375 | 30 days supply | Qty: 120 | Fill #1

## 2019-06-05 MED FILL — SHINGRIX 50 MCG SUS: 50 | 1 days supply | Qty: 1 | Fill #1

## 2019-06-08 MED FILL — SHINGRIX 50 MCG SUS: 50 | 1 days supply | Qty: 1 | Fill #1

## 2019-07-05 ENCOUNTER — Ambulatory Visit (INDEPENDENT_AMBULATORY_CARE_PROVIDER_SITE_OTHER): Payer: 59 | Admitting: Nurse Practitioner

## 2019-07-18 DIAGNOSIS — H52223 Regular astigmatism, bilateral: Secondary | ICD-10-CM | POA: Diagnosis not present

## 2019-07-18 DIAGNOSIS — H5213 Myopia, bilateral: Secondary | ICD-10-CM | POA: Diagnosis not present

## 2019-07-18 DIAGNOSIS — H524 Presbyopia: Secondary | ICD-10-CM | POA: Diagnosis not present

## 2019-07-31 MED FILL — MESALAMINE ER 0.375 GM CP24: 0.375 | 30 days supply | Qty: 120 | Fill #2

## 2019-09-26 MED FILL — MESALAMINE ER 0.375 GM CP24: 0.375 | 30 days supply | Qty: 120 | Fill #3

## 2019-11-24 MED FILL — MESALAMINE ER 0.375 GM CP24: 0.375 | 30 days supply | Qty: 120 | Fill #4

## 2020-01-25 MED FILL — MESALAMINE ER 0.375 GM CP24: 0.375 | 30 days supply | Qty: 120 | Fill #5

## 2020-04-16 ENCOUNTER — Other Ambulatory Visit (INDEPENDENT_AMBULATORY_CARE_PROVIDER_SITE_OTHER): Payer: Self-pay | Admitting: *Deleted

## 2020-04-16 DIAGNOSIS — K5120 Ulcerative (chronic) proctitis without complications: Secondary | ICD-10-CM

## 2020-04-16 MED ORDER — MESALAMINE ER 0.375 G PO CP24: 375.0000 mg | ORAL_CAPSULE | Freq: Four times a day (QID) | ORAL | 6 refills | Status: DC

## 2020-04-16 MED FILL — MESALAMINE ER 0.375 GM CP24: 0.375 | 30 days supply | Qty: 120 | Fill #0

## 2020-05-13 ENCOUNTER — Ambulatory Visit (INDEPENDENT_AMBULATORY_CARE_PROVIDER_SITE_OTHER): Payer: 59 | Admitting: Gastroenterology

## 2020-05-16 ENCOUNTER — Other Ambulatory Visit: Payer: Self-pay

## 2020-05-16 ENCOUNTER — Encounter (INDEPENDENT_AMBULATORY_CARE_PROVIDER_SITE_OTHER): Payer: Self-pay | Admitting: Gastroenterology

## 2020-05-16 ENCOUNTER — Ambulatory Visit (INDEPENDENT_AMBULATORY_CARE_PROVIDER_SITE_OTHER): Payer: 59 | Admitting: Gastroenterology

## 2020-05-16 VITALS — BP 151/78 | HR 81 | Temp 96.9°F | Ht 64.0 in | Wt 167.9 lb

## 2020-05-16 DIAGNOSIS — K5120 Ulcerative (chronic) proctitis without complications: Secondary | ICD-10-CM

## 2020-05-16 MED ORDER — MESALAMINE ER 0.375 G PO CP24: 375.0000 mg | ORAL_CAPSULE | Freq: Four times a day (QID) | ORAL | 3 refills | Status: DC

## 2020-05-16 MED FILL — MESALAMINE ER 0.375 GM CP24: 0.375 | 90 days supply | Qty: 360 | Fill #0

## 2020-05-16 NOTE — Progress Notes (Addendum)
Patient profile: Barbara Martin is a 59 y.o. female seen for follow-up of ulcerative colitis  History of Present Illness: Barbara Martin is seen today for follow-up.  She was last seen 2019.  She reports overall doing well on her current regimen of Apriso, she takes 1 tablet 4 times a day.  She does not feel she misses doses and is compliant with 4 times daily dosing.  She is having a stool daily, occasionally every other day.  She denies any blood in stools.  No abdominal pain.  She has not used enemas or suppositories several years now.  No constipation or diarrhea.  She denies any nausea, vomiting, epigastric pain, dysphagia.  She denies history of colon polyps or colon cancer.  She denies alcohol, tobacco, frequent NSAIDs.  Wt Readings from Last 3 Encounters:  05/16/20 167 lb 14.4 oz (76.2 kg)  03/29/19 172 lb (78 kg)  07/04/18 170 lb 3.2 oz (77.2 kg)     Last Colonoscopy:  08/2015-Findings:   Prep excellent. Normal mucosa of terminal ileum. Normal mucosa of cecum, ascending colon, hepatic flexure, transverse colon, splenic flexure, descending colon and mucosa of proximal sigmoid colon. Single small diverticulum noted at proximal sigmoid colon. Mucosa of distal half of sigmoid colon and rectum revealed diffuse erythema with friable mucosa and multiple erosions and ulcers. Transition was at 37 cm from the anal margin. Similar changes noted involving the rectal mucosa. Anorectal junction unremarkable.  Biopsy results reviewed with patient. It shows ulcerative colitis.  Last Endoscopy:    Past Medical History:  Past Medical History:  Diagnosis Date  . Asthma   . Rectal bleeding   . Ulcerative colitis ([place])     Problem List: Patient Active Problem List   Diagnosis Date Noted  . Hyperlipidemia 03/31/2019  . Multiple nevi 03/29/2019  . UC (ulcerative colitis) ([place]) 11/12/2015  . Rectal bleeding 09/02/2015  . Asthma 09/02/2015    Past Surgical History: Past  Surgical History:  Procedure Laterality Date  . COLONOSCOPY N/A 09/04/2015   Procedure: COLONOSCOPY;  Surgeon: Rogene Houston, MD;  Location[facility];  Service: Endoscopy;  Laterality: N/A;  1:15 - moved to 12:15 - Ann to notify  . TUBAL LIGATION     1992  . WRIST SURGERY     rt wrist surgery x 2 for a varicose vein    Allergies: No Known Allergies    Home Medications:  Current Outpatient Medications:  .  mesalamine (APRISO) 0.375 g 24 hr capsule, Take 1 capsule (0.375 g total) by mouth in the morning, at noon, in the evening, and at bedtime., Disp: 360 capsule, Rfl: 3 .  triamcinolone cream (KENALOG) 0.1 %, Apply 1 application topically 2 (two) times daily., Disp: 30 g, Rfl: 0   Family History: family history includes Arthritis in her father; Asthma in her brother; Cancer in her father; Dementia in her mother; Diabetes in her brother; Heart disease in her brother; Heart disease (age of onset: 38) in her brother; Hyperlipidemia in her father and mother; Hypertension in her mother.    Social History:   reports that she has never smoked. She has never used smokeless tobacco. She reports that she does not drink alcohol and does not use drugs.   Review of Systems: Constitutional: Denies weight loss/weight gain  Eyes: No changes in vision. ENT: No oral lesions, sore throat.  GI: see HPI.  Heme/Lymph: No easy bruising.  CV: No chest pain.  GU: No hematuria.  Integumentary:  No rashes.  Neuro: No headaches.  Psych: No depression/anxiety.  Endocrine: No heat/cold intolerance.  Allergic/Immunologic: No urticaria.  Resp: No cough, SOB.  Musculoskeletal: No joint swelling.    Physical Examination: BP (!) 151/78 (BP Location: Right Arm, Patient Position: Sitting, Cuff Size: Normal)   Pulse 81   Temp (!) 96.9 F (36.1 C) (Temporal)   Ht _0  (1.626 m)   Wt 167 lb 14.4 oz (76.2 kg)   LMP 02/01/2011   BMI 28.82 kg/m  Gen: NAD, alert and oriented x 4 HEENT: PEERLA,  EOMI, Neck: supple, no JVD Chest: CTA bilaterally, no wheezes, crackles, or other adventitious sounds CV: RRR, no m/g/c/r Abd: soft, NT, ND, +BS in all four quadrants; no HSM, guarding, ridigity, or rebound tenderness Ext: no edema, well perfused with 2+ pulses, Skin: no rash or lesions noted on observed skin Lymph: no noted LAD  Data Reviewed:   03/2019---hcv ab negative, HIV negative. CMP normal. CBC normal.   Assessment/Plan: Ms. Castelo is a 59 y.o. female seen for yearly follow-up  1.  Ulcerative colitis-clinically in remission on Apriso 1.5 g daily.  We will continue Apriso. She will notify me with any flare-like symptoms.  She is currently doing well.  Last colonoscopy January 2017.  We will plan to repeat January 2022, discussed today and she would like to call in a few months to schedule.  She denies any prior history of issues w/ sedation.    Layliana was seen today for ulcerative proctitis.  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        I personally performed the service, non-incident to. (WP)  Laurine Blazer, [facility]

## 2020-05-17 LAB — COMPLETE METABOLIC PANEL WITH GFR
AG Ratio: 1.6 (calc) (ref 1.0–2.5)
ALT: 17 U/L (ref 6–29)
AST: 16 U/L (ref 10–35)
Albumin: 4.4 g/dL (ref 3.6–5.1)
Alkaline phosphatase (APISO): 76 U/L (ref 37–153)
BUN: 13 mg/dL (ref 7–25)
CO2: 29 mmol/L (ref 20–32)
Calcium: 9.8 mg/dL (ref 8.6–10.4)
Chloride: 104 mmol/L (ref 98–110)
Creat: 0.78 mg/dL (ref 0.50–0.99)
GFR, Est African American: 96 mL/min/{1.73_m2} (ref >=60)
GFR, Est Non African American: 83 mL/min/{1.73_m2} (ref >=60)
Globulin: 2.7 g/dL (calc) (ref 1.9–3.7)
Glucose, Bld: 91 mg/dL (ref 65–99)
Potassium: 4.2 mmol/L (ref 3.5–5.3)
Sodium: 140 mmol/L (ref 135–146)
Total Bilirubin: 0.4 mg/dL (ref 0.2–1.2)
Total Protein: 7.1 g/dL (ref 6.1–8.1)

## 2020-05-17 LAB — CBC WITH DIFFERENTIAL/PLATELET
Absolute Monocytes: 669 cells/uL (ref 200–950)
Basophils Absolute: 28 cells/uL (ref 0–200)
Basophils Relative: 0.4 %
Eosinophils Absolute: 173 cells/uL (ref 15–500)
Eosinophils Relative: 2.5 %
HCT: 44.6 % (ref 35.0–45.0)
Hemoglobin: 14.1 g/dL (ref 11.7–15.5)
Lymphs Abs: 1615 cells/uL (ref 850–3900)
MCH: 28.5 pg (ref 27.0–33.0)
MCHC: 31.6 g/dL — ABNORMAL LOW (ref 32.0–36.0)
MCV: 90.1 fL (ref 80.0–100.0)
MPV: 10.2 fL (ref 7.5–12.5)
Monocytes Relative: 9.7 %
Neutro Abs: 4416 cells/uL (ref 1500–7800)
Neutrophils Relative %: 64 %
Platelets: 309 10*3/uL (ref 140–400)
RBC: 4.95 10*6/uL (ref 3.80–5.10)
RDW: 12.7 % (ref 11.0–15.0)
Total Lymphocyte: 23.4 %
WBC: 6.9 10*3/uL (ref 3.8–10.8)

## 2020-05-17 LAB — SEDIMENTATION RATE: Sed Rate: 2 mm/h (ref 0–30)

## 2020-05-17 LAB — C-REACTIVE PROTEIN: CRP: 4.2 mg/L (ref <8.0)

## 2020-06-05 ENCOUNTER — Ambulatory Visit: Payer: 59 | Admitting: Family Medicine

## 2020-06-05 ENCOUNTER — Other Ambulatory Visit: Payer: Self-pay

## 2020-06-05 ENCOUNTER — Other Ambulatory Visit: Payer: Self-pay | Admitting: Family Medicine

## 2020-06-05 ENCOUNTER — Encounter: Payer: Self-pay | Admitting: Family Medicine

## 2020-06-05 VITALS — BP 142/78 | HR 70 | Temp 97.5°F | Resp 12 | Ht 64.0 in | Wt 170.0 lb

## 2020-06-05 DIAGNOSIS — N390 Urinary tract infection, site not specified: Secondary | ICD-10-CM | POA: Diagnosis not present

## 2020-06-05 DIAGNOSIS — R319 Hematuria, unspecified: Secondary | ICD-10-CM

## 2020-06-05 DIAGNOSIS — R300 Dysuria: Secondary | ICD-10-CM | POA: Diagnosis not present

## 2020-06-05 LAB — URINALYSIS, ROUTINE W REFLEX MICROSCOPIC
Bilirubin Urine: NEGATIVE
Glucose, UA: NEGATIVE
Hyaline Cast: NONE SEEN /LPF
Ketones, ur: NEGATIVE
Nitrite: NEGATIVE
Protein, ur: NEGATIVE
Specific Gravity, Urine: 1.02 (ref 1.001–1.03)
pH: 7.5 (ref 5.0–8.0)

## 2020-06-05 LAB — MICROSCOPIC MESSAGE

## 2020-06-05 MED ORDER — NITROFURANTOIN MONOHYD MACRO 100 MG PO CAPS: 100.0000 mg | ORAL_CAPSULE | Freq: Two times a day (BID) | ORAL | 0 refills | Status: DC

## 2020-06-05 MED FILL — NITROFURANTOIN MONO-MCR 100: 100 | 7 days supply | Qty: 14 | Fill #0

## 2020-06-05 NOTE — Patient Instructions (Signed)
F/u AS NEEDED

## 2020-06-05 NOTE — Progress Notes (Signed)
   Subjective:    [name], female    DOB: August 16, 1961, 59 y.o.   MRN: [id number]  Patient presents for Urinary Tract Infection (Pt said that since Sunday she has had some issues with frequency and urgency. She said that she burns bad after urniation and she has noticed some blood as well. )  Pt here with UTI SYmptoms, started Sunday No fever, no recent illness Has dysuria with pressure and urgency  She did have gross blood yesterday, none today No change in bowel movements  No fever No abd pain    Review Of Systems:  GEN- denies fatigue, fever, weight loss,weakness, recent illness HEENT- denies eye drainage, change in vision, nasal discharge, CVS- denies chest pain, palpitations RESP- denies SOB, cough, wheeze ABD- denies N/V, change in stools, abd pain GU- + dysuria,+ hematuria, dribbling, incontinence MSK- denies joint pain, muscle aches, injury Neuro- denies headache, dizziness, syncope, seizure activity       Objective:    BP (!) 142/78   Pulse 70   Temp (!) 97.5 F (36.4 C)   Resp 12   Ht 5' 4"  (1.626 m)   Wt 170 lb (77.1 kg)   LMP 02/01/2011   SpO2 99%   BMI 29.18 kg/m  GEN- NAD, alert and oriented x3 HEENT- PERRL, EOMI, non injected sclera, pink conjunctiva CVS- RRR, no murmur RESP-CTAB ABD-NABS,soft,NT,ND, no CVA tenderness  EXT- No edema Pulses- Radial  2+        Assessment & Plan:      Problem List Items Addressed This Visit    None    Visit Diagnoses    Urinary tract infection with hematuria, site unspecified    -  Primary   Treat with macrobid, send for culture as on immunosuppresent mesalamine for her Ulcerative colitis. she eats yogurt a few days a week, keep hydrated    Relevant Medications   nitrofurantoin, macrocrystal-monohydrate, (MACROBID) 100 MG capsule   Other Relevant Orders   Urinalysis, Routine w reflex microscopic (Completed)   Urine Culture      Note: This dictation was prepared with Dragon dictation  along with smaller phrase technology. Any transcriptional errors that result from this process are unintentional.

## 2020-06-07 LAB — URINE CULTURE
MICRO NUMBER:: 11066859
SPECIMEN QUALITY:: ADEQUATE

## 2020-11-26 ENCOUNTER — Other Ambulatory Visit (HOSPITAL_COMMUNITY): Payer: Self-pay

## 2020-11-26 MED ORDER — MESALAMINE ER 0.375 G PO CP24
0.3750 g | ORAL_CAPSULE | Freq: Four times a day (QID) | ORAL | 2 refills | Status: DC
Start: 2020-05-16 — End: 2021-01-13
  Filled 2020-11-26: qty 360, 90d supply, fill #0

## 2020-11-28 ENCOUNTER — Other Ambulatory Visit (HOSPITAL_COMMUNITY): Payer: Self-pay

## 2021-01-13 ENCOUNTER — Encounter: Payer: Self-pay | Admitting: Nurse Practitioner

## 2021-01-13 ENCOUNTER — Other Ambulatory Visit: Payer: Self-pay

## 2021-01-13 ENCOUNTER — Ambulatory Visit: Payer: 59 | Admitting: Nurse Practitioner

## 2021-01-13 DIAGNOSIS — Z7689 Persons encountering health services in other specified circumstances: Secondary | ICD-10-CM | POA: Insufficient documentation

## 2021-01-13 DIAGNOSIS — E785 Hyperlipidemia, unspecified: Secondary | ICD-10-CM | POA: Diagnosis not present

## 2021-01-13 DIAGNOSIS — K51011 Ulcerative (chronic) pancolitis with rectal bleeding: Secondary | ICD-10-CM | POA: Diagnosis not present

## 2021-01-13 NOTE — Assessment & Plan Note (Signed)
-  will check lipids with next set of labs

## 2021-01-13 NOTE — Assessment & Plan Note (Signed)
-  records from Dr. Buelah Manis in [place]

## 2021-01-13 NOTE — Progress Notes (Signed)
New Patient Office Visit  Subjective:  [name], female    DOB: [birth year]  Age: 60 y.o. MRN: [id number]  CC:  Chief Complaint  Patient presents with  . New Patient (Initial Visit)    HPI Barbara Martin presents for new patient visit. Transferring care from Dr. Buelah Manis. Last physical 03/29/2019 Last labs 05/16/20  No acute concerns.  Followed by GI, Rehman, for ulcerative colitis.  Past Medical History:  Diagnosis Date  . Asthma   . Rectal bleeding   . Ulcerative colitis [facility])     Past Surgical History:  Procedure Laterality Date  . COLONOSCOPY N/A 09/04/2015   Procedure: COLONOSCOPY;  Surgeon: Rogene Houston, MD;  Location[facility];  Service: Endoscopy;  Laterality: N/A;  1:15 - moved to 12:15 - Ann to notify  . TUBAL LIGATION     1992  . WRIST SURGERY     rt wrist surgery x 2 for a varicose vein    Family History  Problem Relation Age of Onset  . Hyperlipidemia Mother   . Hypertension Mother   . Dementia Mother   . Arthritis Father   . Cancer Father        leukemia  . Hyperlipidemia Father   . Asthma Brother   . Heart disease Brother   . Diabetes Brother        Type 1 diabetes  . Heart disease Brother 14       valve issue    Social History   Socioeconomic History  . Marital status: Married    Spouse name: Not on file  . Number of children: 1  . Years of education: Not on file  . Highest education level: Not on file  Occupational History  . 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  Tobacco Use  . Smoking status: Never Smoker  . Smokeless tobacco: Never Used  Substance and Sexual Activity  . Alcohol use: No  . Drug use: No  . Sexual activity: Yes    Birth control/protection: Surgical  Other Topics Concern  . Not on file  Social History Narrative  . Not on file   Social Determinants of Health   Financial Resource Strain: Not on file  Food Insecurity: Not on file  Transportation Needs: Not on file  Physical  Activity: Not on file  Stress: Not on file  Social Connections: Not on file  Intimate Partner Violence: Not on file    ROS Review of Systems  Objective:   Today's Vitals: BP 132/75   Pulse 87   Temp 98.5 F (36.9 C)   Resp 20   Ht 5' 4"  (1.626 m)   Wt 165 lb (74.8 kg)   LMP 02/01/2011   SpO2 98%   BMI 28.32 kg/m   Physical Exam  Assessment & Plan:   Problem List Items Addressed This Visit      Digestive   UC (ulcerative colitis) ([place])    -followed by Dr. Laural Golden        Other   Hyperlipidemia    -will check lipids with next set of labs      Encounter to establish care    -records from Dr. Buelah Manis in Epic         Outpatient Encounter Medications as of 01/13/2021  Medication Sig  . mesalamine (APRISO) 0.375 g 24 hr capsule Take 1 capsule (0.375 g total) by mouth in the morning, at noon, in the evening, and at bedtime.  . [  DISCONTINUED] mesalamine (APRISO) 0.375 g 24 hr capsule Take 1 capsule (0.375 g total) by mouth in the morning, at noon, in the evening, and at bedtime.  . [DISCONTINUED] nitrofurantoin, macrocrystal-monohydrate, (MACROBID) 100 MG capsule TAKE 1 CAPSULE (100 MG TOTAL) BY MOUTH 2 (TWO) TIMES DAILY.  . [DISCONTINUED] triamcinolone cream (KENALOG) 0.1 % Apply 1 application topically 2 (two) times daily.   No facility-administered encounter medications on file as of 01/13/2021.    Follow-up: Return in about 2 weeks (around 01/27/2021) for Physical Exam (NO PAP).   Noreene Larsson, NP

## 2021-01-13 NOTE — Patient Instructions (Signed)
Please have fasting labs drawn 2-3 days prior to your appointment so we can discuss the results during your office visit.  

## 2021-01-13 NOTE — Assessment & Plan Note (Signed)
-  followed by Dr. Laural Golden

## 2021-01-27 ENCOUNTER — Encounter: Payer: 59 | Admitting: Nurse Practitioner

## 2021-05-19 ENCOUNTER — Ambulatory Visit (INDEPENDENT_AMBULATORY_CARE_PROVIDER_SITE_OTHER): Payer: 59 | Admitting: Gastroenterology

## 2021-05-20 ENCOUNTER — Encounter (INDEPENDENT_AMBULATORY_CARE_PROVIDER_SITE_OTHER): Payer: Self-pay | Admitting: Gastroenterology

## 2021-05-20 ENCOUNTER — Ambulatory Visit (INDEPENDENT_AMBULATORY_CARE_PROVIDER_SITE_OTHER): Payer: 59 | Admitting: Gastroenterology

## 2021-05-20 ENCOUNTER — Other Ambulatory Visit (HOSPITAL_COMMUNITY): Payer: Self-pay

## 2021-05-20 ENCOUNTER — Other Ambulatory Visit: Payer: Self-pay

## 2021-05-20 DIAGNOSIS — K5120 Ulcerative (chronic) proctitis without complications: Secondary | ICD-10-CM | POA: Diagnosis not present

## 2021-05-20 MED ORDER — MESALAMINE ER 0.375 G PO CP24
375.0000 mg | ORAL_CAPSULE | Freq: Four times a day (QID) | ORAL | 3 refills | Status: DC
  Filled 2021-05-20: qty 360, 90d supply, fill #0
  Filled 2022-01-12: qty 360, 90d supply, fill #1

## 2021-05-20 NOTE — Progress Notes (Signed)
Referring Provider: Alycia Rossetti, MD Primary Care Physician:  Barbara Larsson, NP Primary GI Physician: Barbara Martin  Chief Complaint  Patient presents with   Follow-up    1 year follow up. Has mostly one stool a day, sometimes goes two day. Appetite is good.    HPI:   Barbara Martin is a 60 y.o. female with past medical history of asthma and UC.  Patient presenting today for follow up of UC.  UC: diagnosed in January 2017, she is currently maintained on mesalamine/Apriso one tablet QID (1.5g/day) and appears to be in remission. She reports BMs once a day or every two days. She reports her appetite is good. Her weight is stable, last CRP in sept 2021 was 4.2, ESR 2. CMP and CBC all WNL  Social hx: no etoh, tobacco or illicit drugs Family hx: neg CRC hx  Last Colonoscopy:(09/04/15)Normal mucosa of terminal ileum. Acute colitis involving the rectal mucosa and distal half of sigmoid colon with transition zone at 37 cm from the anal margin. Endoscopic appearance typical of ulcerative colitis. Biopsies taken from mucosa of sigmoid colon and rectum Last Endoscopy:n/a  Recommendations:  Can repeat colonoscopy in 2027 unless new symptoms arise (as recommended by Dr. Laural Martin)  Past Medical History:  Diagnosis Date   Asthma    Rectal bleeding    Ulcerative colitis [facility])     Past Surgical History:  Procedure Laterality Date   COLONOSCOPY N/A 09/04/2015   Procedure: COLONOSCOPY;  Surgeon: Barbara Houston, MD;  Location: [facility];  Service: Endoscopy;  Laterality: N/A;  1:15 - moved to 12:15 - Ann to notify   [place]     rt wrist surgery x 2 for a varicose vein    Current Outpatient Medications  Medication Sig Dispense Refill   cetirizine (ZYRTEC) 10 MG tablet Take 10 mg by mouth daily.     mesalamine (APRISO) 0.375 g 24 hr capsule Take 1 capsule (0.375 g total) by mouth in the morning, at noon, in the evening, and at bedtime. 360 capsule 3    No current facility-administered medications for this visit.    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 Denies fatigue, weakness, weight loss.  CV: Denies chest pain, palpitations, syncope, peripheral edema, and claudication. Resp: Denies dyspnea at rest, cough, wheezing, coughing up blood, and pleurisy. GI: Denies vomiting blood, jaundice, and fecal incontinence. Denies dysphagia or odynophagia. Derm: Denies rash, itching, dry skin Psych: Denies depression, anxiety, memory loss, confusion. No homicidal or suicidal ideation.  Heme: Denies bruising, bleeding, and enlarged lymph nodes.  Physical Exam: BP (!) 145/77 (BP Location: Right Arm, Patient Position: Sitting, Cuff Size: Large)   Pulse (!) 103   Temp 98.5 F (36.9 C)  (Oral)   Ht 5' 4"  (1.626 m)   Wt 170 lb (77.1 kg)   LMP 02/01/2011   BMI 29.18 kg/m  General:   Alert and oriented. No distress noted. Pleasant and cooperative.  Head:  Normocephalic and atraumatic. Eyes:  Conjuctiva clear without scleral icterus. Mouth:  Oral mucosa pink and moist. Good dentition. No lesions. Heart: Normal rate and rhythm, s1 and s2 heart sounds present.  Lungs: Clear lung sounds in all lobes. Respirations equal and unlabored. Abdomen:  +BS, soft, non-tender and non-distended. No rebound or guarding. No HSM or masses noted. Derm: No palmar erythema or jaundice Msk:  Symmetrical without gross deformities. Normal posture. Extremities:  Without edema. Neurologic:  Alert and  oriented x4 Psych:  Alert and cooperative. Normal mood and affect.  Invalid input(s): 6 MONTHS   ASSESSMENT: Barbara Martin is a 60 y.o. female presenting today for follow up of UC.  Last colonoscopy was in 2017, diagnosed with UC then. She has been in remission since starting Apriso at that time. She is doing very well. Having one BM per day to every other day. Denies diarrhea, abdominal pain, rectal bleeding or melena. No nausea or vomiting. Appetite is good. She denies any weight loss. We will continue with current regiment. Given no family hx of CRC, we will plan for repeat colonscopy in 2027(Per Dr. Olevia Perches recommendation) unless CRP elevated or she begins to have symptoms of a flare. Will check CBC, CMP, ESR and CRP today.   PLAN:  Continue Apriso 1.5g per day, one tablet QID 2. Will update ESR, CRP, CBC and CMP today 3. Plan for repeat colonoscopy in 2027 unless UC flare occurs or CRP elevated   Follow Up: 1 year  Barbara Martin L. Alver Sorrow, MSN, APRN, AGNP-C Adult-Gerontology Nurse Practitioner [facility]

## 2021-05-20 NOTE — Patient Instructions (Signed)
Continue Apriso one tablet, 4x/day. Please let us know if you develop any severe abdominal pain, diarrhea, rectal bleeding or black stools. We will update labs today.  Follow up 1 year

## 2021-05-21 ENCOUNTER — Other Ambulatory Visit (HOSPITAL_COMMUNITY): Payer: Self-pay

## 2021-05-21 LAB — CBC WITH DIFFERENTIAL/PLATELET
Absolute Monocytes: 702 cells/uL (ref 200–950)
Basophils Absolute: 39 cells/uL (ref 0–200)
Basophils Relative: 0.6 %
Eosinophils Absolute: 241 cells/uL (ref 15–500)
Eosinophils Relative: 3.7 %
HCT: 42.4 % (ref 35.0–45.0)
Hemoglobin: 13.8 g/dL (ref 11.7–15.5)
Lymphs Abs: 1274 cells/uL (ref 850–3900)
MCH: 28.9 pg (ref 27.0–33.0)
MCHC: 32.5 g/dL (ref 32.0–36.0)
MCV: 88.7 fL (ref 80.0–100.0)
MPV: 10.5 fL (ref 7.5–12.5)
Monocytes Relative: 10.8 %
Neutro Abs: 4245 cells/uL (ref 1500–7800)
Neutrophils Relative %: 65.3 %
Platelets: 281 10*3/uL (ref 140–400)
RBC: 4.78 10*6/uL (ref 3.80–5.10)
RDW: 12.8 % (ref 11.0–15.0)
Total Lymphocyte: 19.6 %
WBC: 6.5 10*3/uL (ref 3.8–10.8)

## 2021-05-21 LAB — COMPREHENSIVE METABOLIC PANEL
AG Ratio: 1.6 (calc) (ref 1.0–2.5)
ALT: 25 U/L (ref 6–29)
AST: 18 U/L (ref 10–35)
Albumin: 4.3 g/dL (ref 3.6–5.1)
Alkaline phosphatase (APISO): 89 U/L (ref 37–153)
BUN: 14 mg/dL (ref 7–25)
CO2: 22 mmol/L (ref 20–32)
Calcium: 9.4 mg/dL (ref 8.6–10.4)
Chloride: 106 mmol/L (ref 98–110)
Creat: 0.85 mg/dL (ref 0.50–1.05)
Globulin: 2.7 g/dL (calc) (ref 1.9–3.7)
Glucose, Bld: 80 mg/dL (ref 65–139)
Potassium: 3.9 mmol/L (ref 3.5–5.3)
Sodium: 140 mmol/L (ref 135–146)
Total Bilirubin: 0.3 mg/dL (ref 0.2–1.2)
Total Protein: 7 g/dL (ref 6.1–8.1)

## 2021-05-21 LAB — C-REACTIVE PROTEIN: CRP: 14.7 mg/L — ABNORMAL HIGH (ref <8.0)

## 2021-05-21 LAB — SEDIMENTATION RATE: Sed Rate: 6 mm/h (ref 0–30)

## 2021-05-26 NOTE — Addendum Note (Signed)
Addended by: Gabriel Rung on: 05/26/2021 03:33 PM   Modules accepted: Orders

## 2021-05-27 ENCOUNTER — Other Ambulatory Visit (INDEPENDENT_AMBULATORY_CARE_PROVIDER_SITE_OTHER): Payer: Self-pay | Admitting: *Deleted

## 2021-05-27 DIAGNOSIS — K5120 Ulcerative (chronic) proctitis without complications: Secondary | ICD-10-CM

## 2021-05-27 DIAGNOSIS — K51219 Ulcerative (chronic) proctitis with unspecified complications: Secondary | ICD-10-CM

## 2021-07-01 ENCOUNTER — Telehealth (INDEPENDENT_AMBULATORY_CARE_PROVIDER_SITE_OTHER): Payer: Self-pay | Admitting: Gastroenterology

## 2021-07-01 NOTE — Telephone Encounter (Signed)
Called number on message and husband states she is at work and to call cell [phone]. Tried to call no answer. Will call back later today.

## 2021-07-01 NOTE — Telephone Encounter (Signed)
Patient left voice mail message stating she would like to speak with [facility] - please advise - [phone]

## 2021-07-02 NOTE — Telephone Encounter (Signed)
Pt states she got a mychart message about doing some additional testing and she is ready to do that but was not sure what test she needed.   Copied and pasted [name]'s mychart message below: Mrs. Rail, your labs look good, your CRP was slightly elevated, this is a marker for inflammation in your body, however, it is not always very specific to UC and can be slightly elevated without much cause, I am going to order a fecal calprotectin test which shows presence of inflammation that is more specific to your UC, and repeat the CRP just to make sure there is no evidence of a flare up starting. I still do not think we need to do a colonoscopy at this time, unless you begin having symptoms. We can mail you the lab orders or you can stop by the office and pick them up, just let us know what you prefer, thanks!  Left message to return call to discuss with pt.

## 2021-07-03 ENCOUNTER — Telehealth (INDEPENDENT_AMBULATORY_CARE_PROVIDER_SITE_OTHER): Payer: Self-pay | Admitting: *Deleted

## 2021-07-03 NOTE — Telephone Encounter (Signed)
Pt states she got a mychart message about doing some additional testing and she is ready to do that but was not sure what test she needed.    Copied and pasted [name]'s mychart message below: Barbara Martin, your labs look good, your CRP was slightly elevated, this is a marker for inflammation in your body, however, it is not always very specific to UC and can be slightly elevated without much cause, I am going to order a fecal calprotectin test which shows presence of inflammation that is more specific to your UC, and repeat the CRP just to make sure there is no evidence of a flare up starting. I still do not think we need to do a colonoscopy at this time, unless you begin having symptoms. We can mail you the lab orders or you can stop by the office and pick them up, just let us know what you prefer, thanks!   Left message to return call to discuss with pt.   Pt called back and I discussed with her and she states she is not having any symptoms and wanted bloodwork orders mailed to her. Orders placed in the mail.

## 2021-07-09 DIAGNOSIS — K5120 Ulcerative (chronic) proctitis without complications: Secondary | ICD-10-CM | POA: Diagnosis not present

## 2021-07-09 DIAGNOSIS — K51219 Ulcerative (chronic) proctitis with unspecified complications: Secondary | ICD-10-CM | POA: Diagnosis not present

## 2021-07-10 LAB — C-REACTIVE PROTEIN: CRP: 1.8 mg/L (ref <8.0)

## 2021-07-15 ENCOUNTER — Encounter: Payer: 59 | Admitting: Nurse Practitioner

## 2021-07-16 LAB — CALPROTECTIN: Calprotectin: 94 mcg/g

## 2021-07-24 ENCOUNTER — Other Ambulatory Visit: Payer: Self-pay

## 2021-07-24 ENCOUNTER — Encounter: Payer: Self-pay | Admitting: Nurse Practitioner

## 2021-07-24 ENCOUNTER — Other Ambulatory Visit (HOSPITAL_COMMUNITY): Payer: Self-pay

## 2021-07-24 ENCOUNTER — Ambulatory Visit: Payer: 59 | Admitting: Nurse Practitioner

## 2021-07-24 VITALS — BP 149/77 | HR 74 | Ht 64.0 in | Wt 170.0 lb

## 2021-07-24 DIAGNOSIS — Z0000 Encounter for general adult medical examination without abnormal findings: Secondary | ICD-10-CM | POA: Diagnosis not present

## 2021-07-24 DIAGNOSIS — E785 Hyperlipidemia, unspecified: Secondary | ICD-10-CM | POA: Diagnosis not present

## 2021-07-24 DIAGNOSIS — Z139 Encounter for screening, unspecified: Secondary | ICD-10-CM | POA: Diagnosis not present

## 2021-07-24 DIAGNOSIS — I10 Essential (primary) hypertension: Secondary | ICD-10-CM | POA: Diagnosis not present

## 2021-07-24 DIAGNOSIS — E663 Overweight: Secondary | ICD-10-CM | POA: Diagnosis not present

## 2021-07-24 DIAGNOSIS — K51011 Ulcerative (chronic) pancolitis with rectal bleeding: Secondary | ICD-10-CM

## 2021-07-24 DIAGNOSIS — L989 Disorder of the skin and subcutaneous tissue, unspecified: Secondary | ICD-10-CM | POA: Diagnosis not present

## 2021-07-24 MED ORDER — AMLODIPINE BESYLATE 5 MG PO TABS
5.0000 mg | ORAL_TABLET | Freq: Every day | ORAL | 3 refills | Status: DC
Start: 2021-07-24 — End: 2021-09-04
  Filled 2021-07-24: qty 90, 90d supply, fill #0

## 2021-07-24 NOTE — Assessment & Plan Note (Signed)
Amlodipine 66m daily. Low salt diet, regular vigorous exercise 30 minutes 5 times a week.  Follow up in 6 weeks

## 2021-07-24 NOTE — Assessment & Plan Note (Addendum)
Annual exam as documented.  Counseling done include healthy lifestyle involving committing to 150 minutes of exercise per week, heart healthy diet, and attaining healthy weight. The importance of adequate sleep also discussed.  Regular use of seat belt and home safety were also discussed . Changes in health habits are decided on by patient with goals and time frames set for achieving them. Immunization and cancer screening  needs are specifically addressed at this visit.   Has eye exam coming up next year.  Labs today

## 2021-07-24 NOTE — Assessment & Plan Note (Signed)
importance of regular vigorous 30  minutes  5 times a week, portion and healthy diet discussed.

## 2021-07-24 NOTE — Assessment & Plan Note (Signed)
Check lipid today

## 2021-07-24 NOTE — Patient Instructions (Signed)
You have been referred for mammogram. Get your labs done today. Please measure your blood pressure keep a log and bring to your next appointment    It is important that you exercise regularly at least 30 minutes 5 times a week.  Think about what you will eat, plan ahead. Choose " clean, green, fresh or frozen" over canned, processed or packaged foods which are more sugary, salty and fatty. 70 to 75% of food eaten should be vegetables and fruit. Three meals at set times with snacks allowed between meals, but they must be fruit or vegetables. Aim to eat over a 12 hour period , example 7 am to 7 pm, and STOP after  your last meal of the day. Drink water,generally about 64 ounces per day, no other drink is as healthy. Fruit juice is best enjoyed in a healthy way, by EATING the fruit.  Thanks for choosing [facility], we consider it a privelige to serve you.

## 2021-07-24 NOTE — Progress Notes (Signed)
   Barbara Martin     MRN: [id number]      DOB: [birth year]   HPI Barbara Martin is here for annual exam  and re-evaluation of chronic medical conditions, medication management and review of any available recent lab and radiology data.  Preventive health is updated, specifically  Cancer screening and Immunization.   Questions or concerns regarding consultations or procedures which the PT has had in the interim are  addressed. The PT denies any adverse reactions to current medications since the last visit.  There are no new concerns.  There are no specific complaints   ROS Denies recent fever or chills. Denies sinus pressure, nasal congestion, ear pain or sore throat. Denies chest congestion, productive cough or wheezing. Denies chest pains, palpitations and leg swelling Denies abdominal pain, nausea, vomiting,diarrhea or constipation.   Denies dysuria, frequency, hesitancy or incontinence. Denies joint pain, swelling and limitation in mobility. Denies headaches, seizures, numbness, or tingling. Denies depression, anxiety or insomnia. Denies skin break down or rash.   PE  BP (!) 149/77 (BP Location: Left Arm, Patient Position: Sitting, Cuff Size: Normal)   Pulse 74   Ht 5' 4"  (1.626 m)   Wt 170 lb (77.1 kg)   LMP 02/01/2011   SpO2 98%   BMI 29.18 kg/m   Patient alert and oriented and in no cardiopulmonary distress.  HEENT: No facial asymmetry, EOMI,     Neck supple .  Chest: Clear to auscultation bilaterally.  Breasts: Normal breat exam, no lymphadenopathy, no nipple inversion, no ulcers, no skin   Discoloration, no drainage   CVS: S1, S2 no murmurs, no S3.Regular rate.  ABD: Soft non tender.   Ext: No edema  MS: Adequate ROM spine, shoulders, hips and knees.  Skin: Intact, no ulcerations or rash noted. Skin lesion most likely seborrheic keratosis noted on the posterior back  Psych: Good eye contact, normal affect. Memory intact not anxious or depressed  appearing.  CNS: CN 2-12 intact, power,  normal throughout.no focal deficits noted.   Assessment & Plan

## 2021-07-24 NOTE — Assessment & Plan Note (Signed)
Sees GI. Dr. Laural Golden.

## 2021-07-24 NOTE — Assessment & Plan Note (Signed)
Could be seborrheic keratosis, will refer to dermatology for eval.

## 2021-07-25 LAB — LIPID PANEL
Chol/HDL Ratio: 4 ratio (ref 0.0–4.4)
Cholesterol, Total: 242 mg/dL — ABNORMAL HIGH (ref 100–199)
HDL: 61 mg/dL (ref >39)
LDL Chol Calc (NIH): 164 mg/dL — ABNORMAL HIGH (ref 0–99)
Triglycerides: 97 mg/dL (ref 0–149)
VLDL Cholesterol Cal: 17 mg/dL (ref 5–40)

## 2021-07-25 LAB — HEMOGLOBIN A1C
Est. average glucose Bld gHb Est-mCnc: 117 mg/dL
Hgb A1c MFr Bld: 5.7 % — ABNORMAL HIGH (ref 4.8–5.6)

## 2021-07-25 LAB — VITAMIN D 25 HYDROXY (VIT D DEFICIENCY, FRACTURES): Vit D, 25-Hydroxy: 31.8 ng/mL (ref 30.0–100.0)

## 2021-07-25 LAB — TSH: TSH: 1.59 u[IU]/mL (ref 0.450–4.500)

## 2021-07-30 ENCOUNTER — Ambulatory Visit (HOSPITAL_COMMUNITY)
Admission: RE | Admit: 2021-07-30 | Discharge: 2021-07-30 | Disposition: A | Discharge status: Home / Self Care | Payer: 59 | Source: Ambulatory Visit | Attending: Nurse Practitioner | Admitting: Nurse Practitioner

## 2021-07-30 ENCOUNTER — Other Ambulatory Visit: Payer: Self-pay

## 2021-07-30 DIAGNOSIS — Z1231 Encounter for screening mammogram for malignant neoplasm of breast: Secondary | ICD-10-CM | POA: Insufficient documentation

## 2021-07-30 DIAGNOSIS — Z139 Encounter for screening, unspecified: Secondary | ICD-10-CM

## 2021-07-31 ENCOUNTER — Other Ambulatory Visit (HOSPITAL_COMMUNITY): Payer: Self-pay | Admitting: Nurse Practitioner

## 2021-07-31 ENCOUNTER — Telehealth: Payer: Self-pay

## 2021-07-31 DIAGNOSIS — R928 Other abnormal and inconclusive findings on diagnostic imaging of breast: Secondary | ICD-10-CM

## 2021-07-31 NOTE — Telephone Encounter (Signed)
Can you call this patient about abnormal mammo.  She got a call and did not leave  a detail except something about abnormal mammo.  Please call her today.  Thanks

## 2021-07-31 NOTE — Telephone Encounter (Signed)
Barbara Martin spoke with patient

## 2021-08-06 ENCOUNTER — Ambulatory Visit (HOSPITAL_COMMUNITY)
Admission: RE | Admit: 2021-08-06 | Discharge: 2021-08-06 | Disposition: A | Discharge status: Home / Self Care | Payer: 59 | Source: Ambulatory Visit | Attending: Nurse Practitioner | Admitting: Nurse Practitioner

## 2021-08-06 ENCOUNTER — Other Ambulatory Visit: Payer: Self-pay

## 2021-08-06 DIAGNOSIS — R928 Other abnormal and inconclusive findings on diagnostic imaging of breast: Secondary | ICD-10-CM

## 2021-08-06 DIAGNOSIS — R922 Inconclusive mammogram: Secondary | ICD-10-CM | POA: Diagnosis not present

## 2021-08-07 DIAGNOSIS — L821 Other seborrheic keratosis: Secondary | ICD-10-CM | POA: Diagnosis not present

## 2021-09-04 ENCOUNTER — Encounter: Payer: Self-pay | Admitting: Nurse Practitioner

## 2021-09-04 ENCOUNTER — Other Ambulatory Visit: Payer: Self-pay

## 2021-09-04 ENCOUNTER — Other Ambulatory Visit (HOSPITAL_COMMUNITY): Payer: Self-pay

## 2021-09-04 ENCOUNTER — Ambulatory Visit: Payer: 59 | Admitting: Nurse Practitioner

## 2021-09-04 VITALS — BP 138/78 | HR 77 | Ht 64.0 in | Wt 173.0 lb

## 2021-09-04 DIAGNOSIS — E663 Overweight: Secondary | ICD-10-CM

## 2021-09-04 DIAGNOSIS — E785 Hyperlipidemia, unspecified: Secondary | ICD-10-CM

## 2021-09-04 DIAGNOSIS — I10 Essential (primary) hypertension: Secondary | ICD-10-CM | POA: Diagnosis not present

## 2021-09-04 MED ORDER — AMLODIPINE BESYLATE 5 MG PO TABS
2.5000 mg | ORAL_TABLET | Freq: Every day | ORAL | 3 refills | Status: DC
  Filled 2021-09-04: qty 90, 180d supply, fill #0
  Filled 2021-12-15: qty 15, 30d supply, fill #0

## 2021-09-04 NOTE — Patient Instructions (Addendum)
Start taking amlodipine 2.35m daily in the morning.  Please get your EGFR today.   Eat a healthy diet, including lots of fruits and vegetables. Avoid foods with a lot of saturated and trans fats, such as red meat, butter, fried foods and cheese . Maintain a healthy weight.    It is important that you exercise regularly at least 30 minutes 5 times a week.  Think about what you will eat, plan ahead. Choose " clean, green, fresh or frozen" over canned, processed or packaged foods which are more sugary, salty and fatty. 70 to 75% of food eaten should be vegetables and fruit. Three meals at set times with snacks allowed between meals, but they must be fruit or vegetables. Aim to eat over a 12 hour period , example 7 am to 7 pm, and STOP after  your last meal of the day. Drink water,generally about 64 ounces per day, no other drink is as healthy. Fruit juice is best enjoyed in a healthy way, by EATING the fruit.  [facility] we consider it a privelige to serve you.

## 2021-09-04 NOTE — Assessment & Plan Note (Signed)
Importance of healthy food choices with portion control discussed as well as eating regularly within 12  hour window.   The need to choose clean green food 50%-75% of time is discussed as well as make water the primary drink and set a goal for 64 ounces daily.  Patient reeducated about the importance of committment to minimum of 150 minutes of exercise per week.  Three meals at set times with snacks allowed between meals but they must be fruit or vegetable.   Aim to eat  over 12 hour period  for example 7 am to 7 pm. Stop after your last meal of the day.

## 2021-09-04 NOTE — Assessment & Plan Note (Addendum)
DASH diet and commitment to daily physical activity for a minimum of 30 minutes discussed and encouraged, as a part of hypertension management. The importance of attaining a healthy weight is also discussed.  BP/Weight 09/04/2021 07/24/2021 05/20/2021 01/13/2021 06/05/2020 3/79/0240 05/01/3531  Systolic BP 992 426 834 196 222 979 892  Diastolic BP 78 77 77 75 78 78 76  Wt. (Lbs) 173 170 170 165 170 167.9 172  BMI 29.7 29.18 29.18 28.32 29.18 28.82 29.52  Some encounter information is confidential and restricted. Go to Review Flowsheets activity to see all data.  pt has been taking 73m every other day due to dizziness. Pt told to cut down to amlodipine 2.552mdaily.  EGFR today.

## 2021-09-04 NOTE — Progress Notes (Signed)
° °  Barbara Martin     MRN: [id number]      DOB: 11-09-1960   HPI Barbara Martin is here for follow up for blood pressure. BP at home has been in the range of 118/85- 136/73. Pt stated that she has been taking amlodipine  every other day, she felt dizzy one day after taking med. She has been taking her med around dinner time and she checks her bp before taking her med.  PT has had her mammogram done, no evidence of malignancy.  Pt has had the spot on her bck checked by dermatology , she was told it was benign.    ROS Denies recent fever or chills. Denies sinus pressure, nasal congestion, ear pain or sore throat. Denies chest congestion, productive cough or wheezing. Denies chest pains, palpitations and leg swelling Denies abdominal pain, nausea, vomiting,diarrhea or constipation.   Denies dysuria, frequency, hesitancy or incontinence. Denies joint pain, swelling and limitation in mobility. Denies headaches, seizures, numbness, or tingling. Denies depression, anxiety or insomnia. Denies skin break down or rash.   PE  BP (!) 170/72 (BP Location: Right Arm, Patient Position: Sitting, Cuff Size: Normal)    Pulse 77    Ht 5' 4"  (1.626 m)    Wt 173 lb (78.5 kg)    LMP 02/01/2011    SpO2 99%    BMI 29.70 kg/m   Patient alert and oriented and in no cardiopulmonary distress.  HEENT: No facial asymmetry, EOMI,     Neck supple .  Chest: Clear to auscultation bilaterally.  CVS: S1, S2 no murmurs, no S3.Regular rate.  ABD: Soft non tender.   Ext: No edema  MS: Adequate ROM spine, shoulders, hips and knees.  Skin: Intact, no ulcerations or rash noted.  Psych: Good eye contact, normal affect. Memory intact not anxious or depressed appearing.  CNS: CN 2-12 intact, power,  normal throughout.no focal deficits noted.   Assessment & Plan

## 2021-09-04 NOTE — Assessment & Plan Note (Signed)
Lab Results  Component Value Date   CHOL 242 (H) 07/24/2021   HDL 61 07/24/2021   LDLCALC 164 (H) 07/24/2021   TRIG 97 07/24/2021   CHOLHDL 4.0 07/24/2021  Eat a healthy diet, including lots of fruits and vegetables. Avoid foods with a lot of saturated and trans fats, such as red meat, butter, fried foods and cheese . Maintain a healthy weight.

## 2021-09-05 LAB — CMP14+EGFR
ALT: 14 IU/L (ref 0–32)
AST: 18 IU/L (ref 0–40)
Albumin/Globulin Ratio: 1.7 (ref 1.2–2.2)
Albumin: 4.6 g/dL (ref 3.8–4.8)
Alkaline Phosphatase: 87 IU/L (ref 44–121)
BUN/Creatinine Ratio: 17 (ref 12–28)
BUN: 13 mg/dL (ref 8–27)
Bilirubin Total: 0.4 mg/dL (ref 0.0–1.2)
CO2: 23 mmol/L (ref 20–29)
Calcium: 9.5 mg/dL (ref 8.7–10.3)
Chloride: 103 mmol/L (ref 96–106)
Creatinine, Ser: 0.75 mg/dL (ref 0.57–1.00)
Globulin, Total: 2.7 g/dL (ref 1.5–4.5)
Glucose: 85 mg/dL (ref 70–99)
Potassium: 4.5 mmol/L (ref 3.5–5.2)
Sodium: 140 mmol/L (ref 134–144)
Total Protein: 7.3 g/dL (ref 6.0–8.5)
eGFR: 91 mL/min/{1.73_m2} (ref >59)

## 2021-10-02 DIAGNOSIS — H5203 Hypermetropia, bilateral: Secondary | ICD-10-CM | POA: Diagnosis not present

## 2021-12-03 ENCOUNTER — Ambulatory Visit: Payer: 59 | Admitting: Nurse Practitioner

## 2021-12-03 ENCOUNTER — Encounter: Payer: Self-pay | Admitting: Nurse Practitioner

## 2021-12-03 VITALS — BP 139/77 | HR 90 | Ht 64.0 in | Wt 169.0 lb

## 2021-12-03 DIAGNOSIS — I10 Essential (primary) hypertension: Secondary | ICD-10-CM | POA: Diagnosis not present

## 2021-12-03 DIAGNOSIS — E785 Hyperlipidemia, unspecified: Secondary | ICD-10-CM

## 2021-12-03 DIAGNOSIS — T7840XD Allergy, unspecified, subsequent encounter: Secondary | ICD-10-CM

## 2021-12-03 DIAGNOSIS — E663 Overweight: Secondary | ICD-10-CM | POA: Diagnosis not present

## 2021-12-03 DIAGNOSIS — T7840XA Allergy, unspecified, initial encounter: Secondary | ICD-10-CM | POA: Insufficient documentation

## 2021-12-03 NOTE — Assessment & Plan Note (Signed)
?  BP Readings from Last 3 Encounters:  ?12/03/21 139/77  ?09/04/21 138/78  ?07/24/21 (!) 149/77  ? ?Pt has been taking amlodipine 2.52m daily, no more dizziness, bp logs at home reviewed , BP well controlled ?Continue amlodipine 2.525mdaily ?DASH diet advised, engage in regular exercises 150 minutes a week.  ?

## 2021-12-03 NOTE — Assessment & Plan Note (Signed)
Wt Readings from Last 3 Encounters:  ?12/03/21 169 lb (76.7 kg)  ?09/04/21 173 lb (78.5 kg)  ?07/24/21 170 lb (77.1 kg)  ?Patient states that she has been doing a lot of walking. ?Increase intake of whole foods consisting mainly of vegetables and protein, engage in regular moderate exercise at least 150 minutes a week, importance of portion control also discussed with patient ?

## 2021-12-03 NOTE — Progress Notes (Signed)
? ?  LEDORA Martin     MRN: [id number]      DOB: 09/27/1960 ? ? ?HPI ?Ms. [name] with medical history of hypertension, asthma, ulcerative colitis, hyperlipidemia is here for follow up for hypertension.  Patient stated that she has been taking amlodipine 2.5 mg daily, denies chest pain, shortness of breath, edema, states that her blood pressure has been well controlled at home.  Blood pressure log from home reviewed by me today ? ? ?Pt c/o greenish yellowish nasal drainage had scartcy throat, cough now resolved, denies  fever, chills, malaise, CP , sinus pressure. She has used benadryl and advil, they helped.  ? ? ? ?ROS ?Denies recent fever or chills. ?Denies sinus pressure, nasal congestion, ear pain or sore throat. ?Denies chest congestion, productive cough or wheezing. ?Denies chest pains, palpitations and leg swelling ?Denies abdominal pain, nausea, vomiting,diarrhea or constipation.   ?Denies dysuria, frequency, hesitancy or incontinence.[name] ?Denies depression, anxiety or insomnia. ? ? ? ?PE ? ?BP 139/77 (BP Location: Left Arm, Patient Position: Sitting, Cuff Size: Large)   Pulse 90   Ht 5' 4"  (1.626 m)   Wt 169 lb (76.7 kg)   LMP 02/01/2011   SpO2 98%   BMI 29.01 kg/m?  ? ?Patient alert and oriented and in no cardiopulmonary distress. ? ?HEENT: No facial asymmetry, EOMI,     Neck supple . ? ?Chest: Clear to auscultation bilaterally. ? ?CVS: S1, S2 no murmurs, no S3.Regular rate. ? ?ABD: Soft non tender.  ? ?Ext: No edema ? ?Psych: Good eye contact, normal affect. Memory intact not anxious or depressed appearing. ? ? ? ?Assessment & Plan ? ?Hypertension ? ?BP Readings from Last 3 Encounters:  ?12/03/21 139/77  ?09/04/21 138/78  ?07/24/21 (!) 149/77  ? ?Pt has been taking amlodipine 2.3m daily, no more dizziness, bp logs at home reviewed , BP well controlled ?Continue amlodipine 2.533mdaily ?DASH diet advised, engage in regular exercises 150 minutes a week.  ? ?Hyperlipidemia ?Avoid fried fatty foods,  engage in regular exercises at least 150 minutes weekly. ?We will check lipid panel at next visit ? ?Overweight (BMI 25.0-29.9) ?Wt Readings from Last 3 Encounters:  ?12/03/21 169 lb (76.7 kg)  ?09/04/21 173 lb (78.5 kg)  ?07/24/21 170 lb (77.1 kg)  ?Patient states that she has been doing a lot of walking. ?Increase intake of whole foods consisting mainly of vegetables and protein, engage in regular moderate exercise at least 150 minutes a week, importance of portion control also discussed with patient ? ?Allergies ?Takes cetirizine 10 mg daily ?Taking Flonase nasal spray at home ?Patient told to get OTC saline nasal spray and use as needed, she verbalized understanding  ?

## 2021-12-03 NOTE — Assessment & Plan Note (Signed)
Takes cetirizine 10 mg daily ?Taking Flonase nasal spray at home ?Patient told to get OTC saline nasal spray and use as needed, she verbalized understanding ?

## 2021-12-03 NOTE — Assessment & Plan Note (Signed)
Avoid fried fatty foods, engage in regular exercises at least 150 minutes weekly. ?We will check lipid panel at next visit ?

## 2021-12-03 NOTE — Patient Instructions (Signed)

## 2021-12-15 ENCOUNTER — Other Ambulatory Visit (HOSPITAL_COMMUNITY): Payer: Self-pay

## 2021-12-15 ENCOUNTER — Telehealth: Payer: Self-pay

## 2021-12-15 NOTE — Telephone Encounter (Signed)
Patient called need amLODipine  2.5 mg (cannot cut the 5 mg-wasted by cutting them) ?Request 90 day supply ? ?Pharmacy ? ?[facility]  ?[address], [place] [address]  ?Phone:  [phone]  Fax:  [phone]   ? ? ?

## 2021-12-15 NOTE — Telephone Encounter (Signed)
Please advise 

## 2021-12-16 ENCOUNTER — Other Ambulatory Visit: Payer: Self-pay | Admitting: Nurse Practitioner

## 2021-12-16 ENCOUNTER — Other Ambulatory Visit (HOSPITAL_COMMUNITY): Payer: Self-pay

## 2021-12-16 MED ORDER — AMLODIPINE BESYLATE 2.5 MG PO TABS
2.5000 mg | ORAL_TABLET | Freq: Every day | ORAL | 0 refills | Status: DC
  Filled 2021-12-16 – 2022-01-12 (×2): qty 90, 90d supply, fill #0

## 2021-12-16 NOTE — Progress Notes (Unsigned)
pine ?

## 2021-12-23 ENCOUNTER — Other Ambulatory Visit: Payer: Self-pay | Admitting: Nurse Practitioner

## 2022-01-12 ENCOUNTER — Other Ambulatory Visit (HOSPITAL_COMMUNITY): Payer: Self-pay

## 2022-03-28 IMAGING — MG MM DIGITAL DIAGNOSTIC UNILAT*L* W/ TOMO W/ CAD
4 series · 4 of 12 positions shown · non-contrast
Comparison: Previous exam(s).

CLINICAL DATA: Possible asymmetry in the anteromedial left breast
on a recent screening mammogram

EXAM:
DIGITAL DIAGNOSTIC UNILATERAL LEFT MAMMOGRAM WITH TOMOSYNTHESIS AND
CAD
TECHNIQUE: Left digital diagnostic mammography and breast tomosynthesis was
performed. The images were evaluated with computer-aided detection.

[L ML synth-2D]
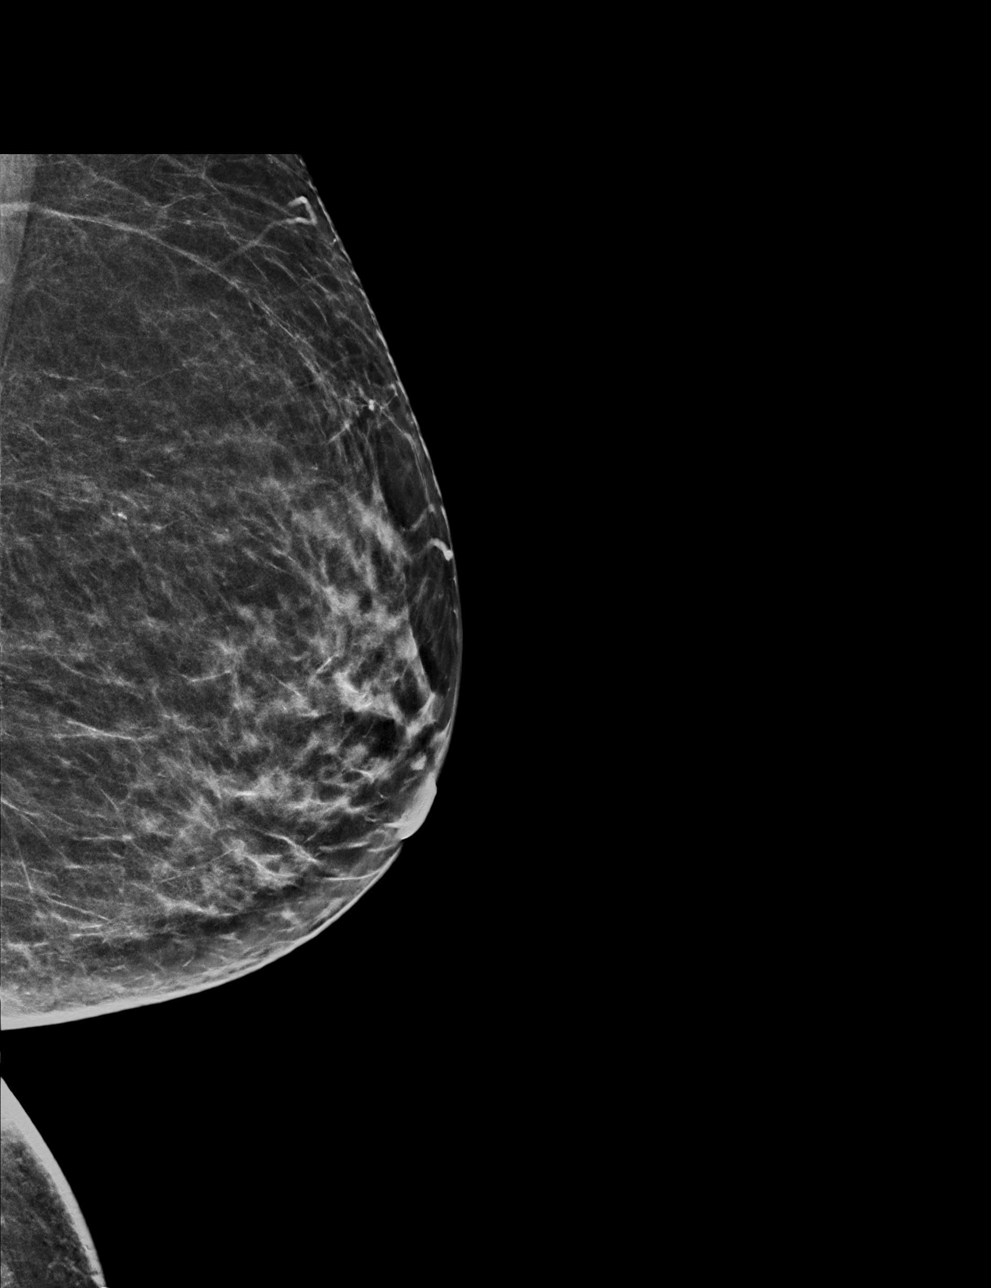

[L CC synth-2D]
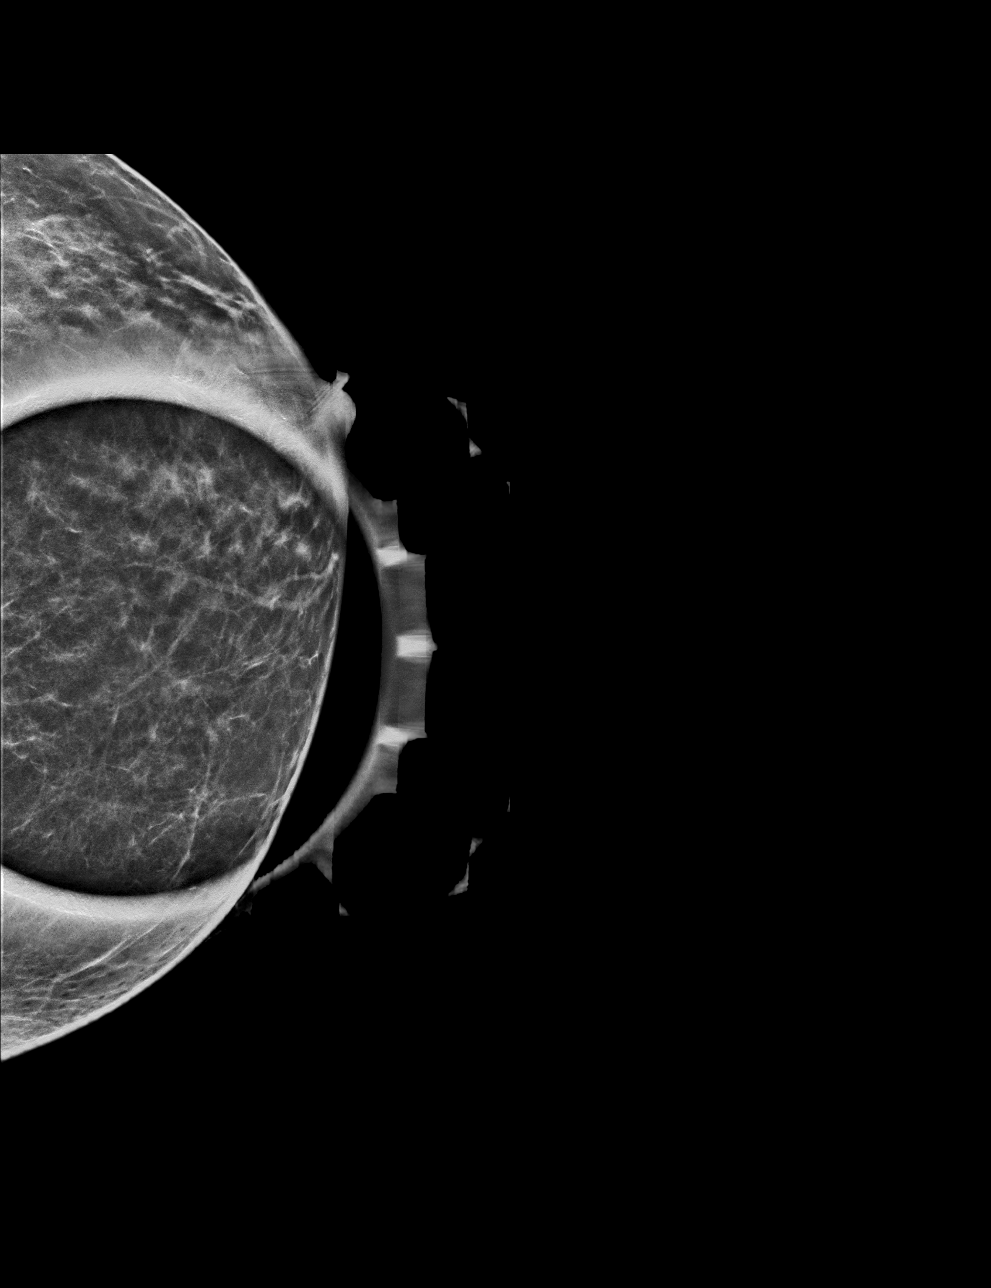

[L CC tomo · tomo slice 25/49.0]
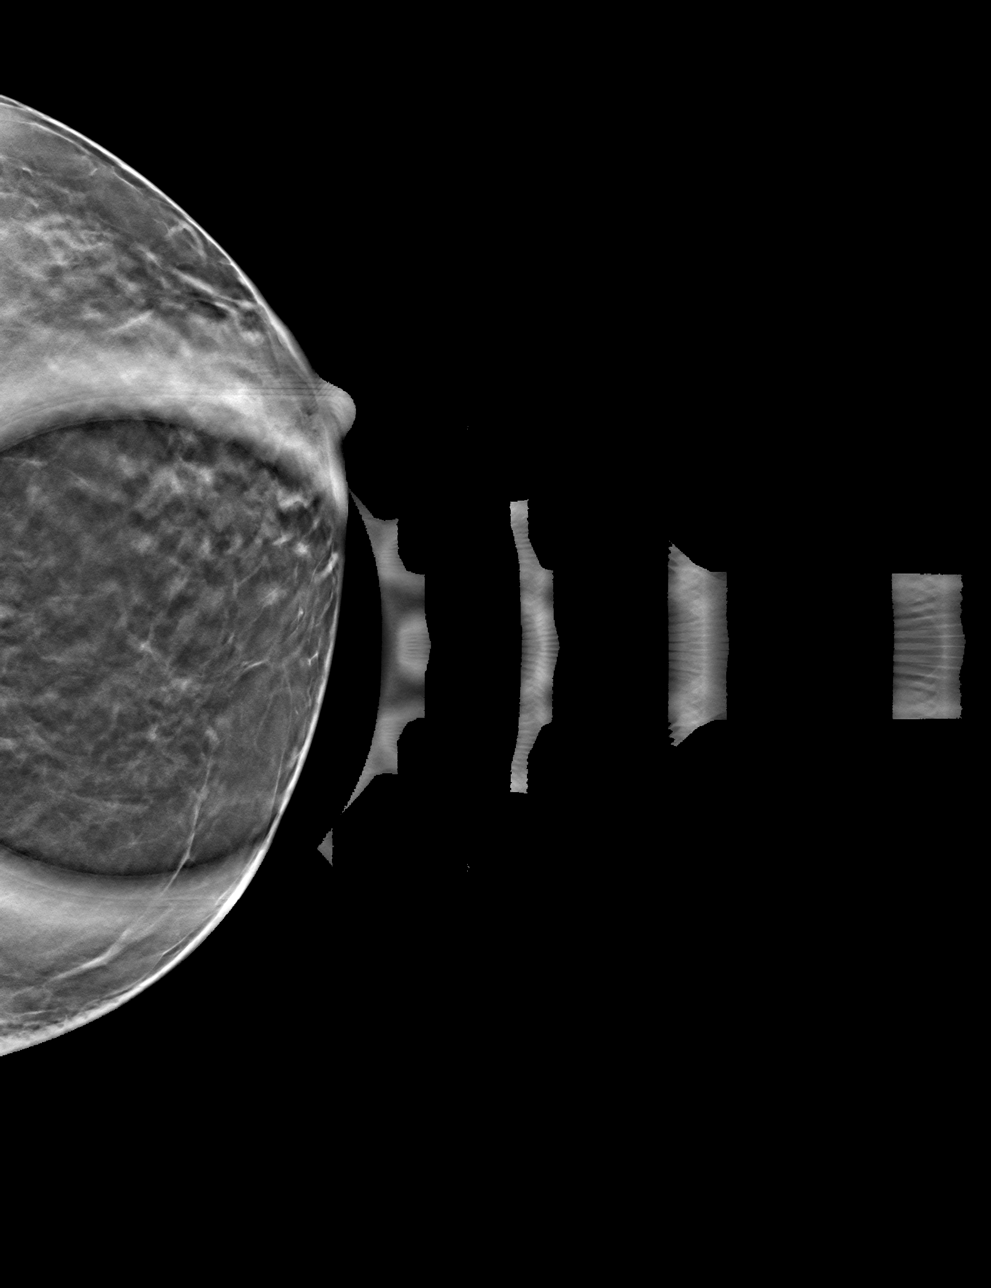

[L ML tomo · tomo slice 32/63.0]
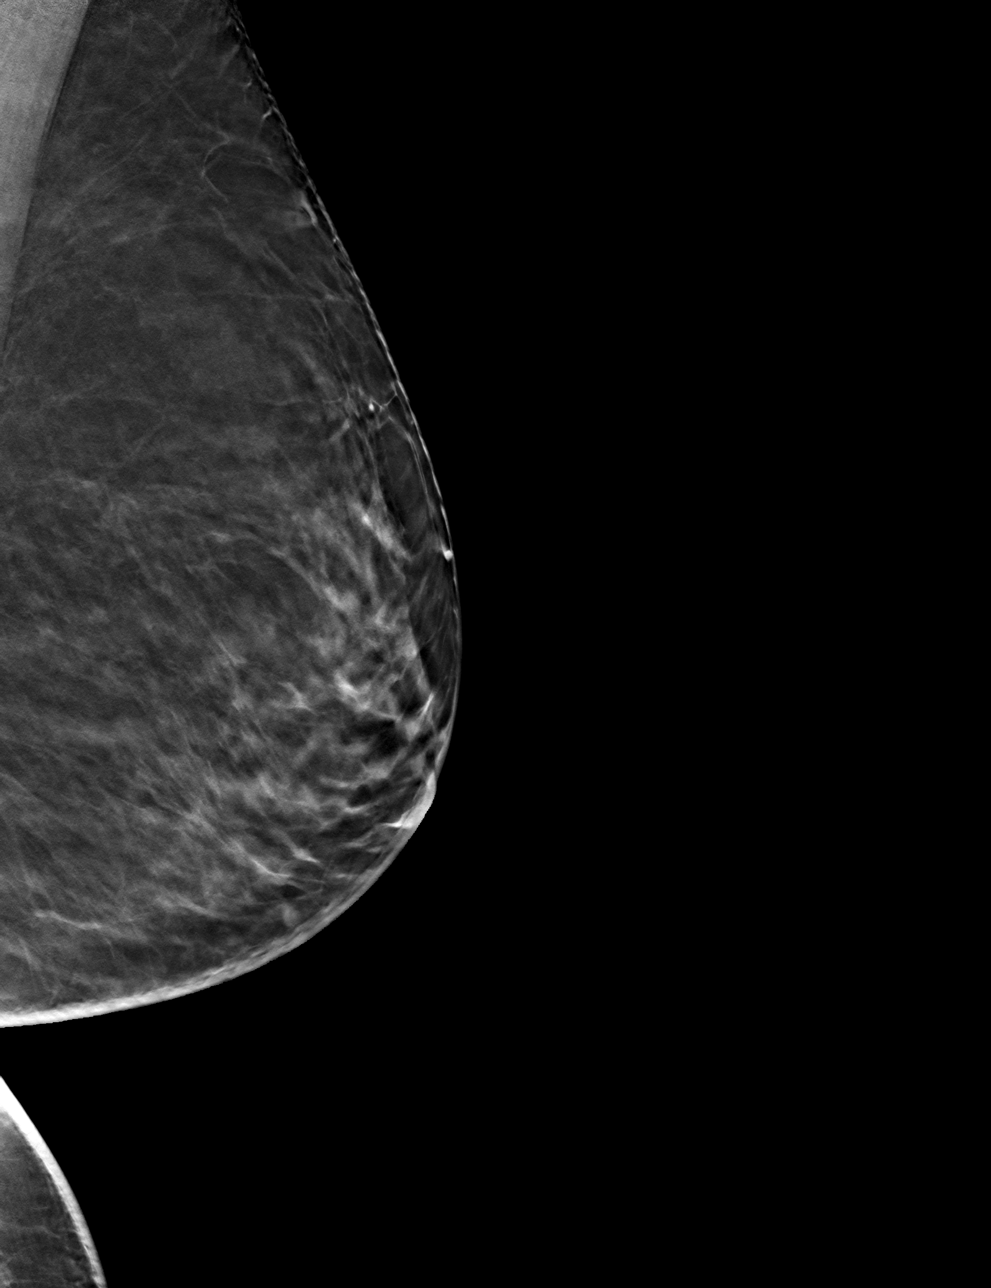

[4 of 12 positions shown; findings below may reference images not displayed]

ACR Breast Density Category b: There are scattered areas of
fibroglandular density.
FINDINGS: 3D tomographic and 2D generated true lateral and spot compression
craniocaudal images of the left breast demonstrate normal appearing
fibroglandular tissue at the location of the recently suspected
asymmetry, unchanged compared to previous examinations.
IMPRESSION: No evidence of malignancy. The recently suspected left breast
asymmetry was close apposition of normal breast tissue.

RECOMMENDATION:
Bilateral screening mammogram in 1 year.

I have discussed the findings and recommendations with the patient.
If applicable, a reminder letter will be sent to the patient
regarding the next appointment.

BI-RADS CATEGORY  1: Negative.

## 2022-04-27 ENCOUNTER — Other Ambulatory Visit: Payer: Self-pay | Admitting: Nurse Practitioner

## 2022-04-28 ENCOUNTER — Other Ambulatory Visit (HOSPITAL_COMMUNITY): Payer: Self-pay

## 2022-04-28 MED ORDER — AMLODIPINE BESYLATE 2.5 MG PO TABS
2.5000 mg | ORAL_TABLET | Freq: Every day | ORAL | 0 refills | Status: DC
  Filled 2022-04-28: qty 90, 90d supply, fill #0

## 2022-06-04 ENCOUNTER — Encounter: Payer: Self-pay | Admitting: Family Medicine

## 2022-06-04 ENCOUNTER — Ambulatory Visit: Payer: 59 | Admitting: Family Medicine

## 2022-06-04 ENCOUNTER — Ambulatory Visit: Payer: 59 | Admitting: Nurse Practitioner

## 2022-06-04 VITALS — BP 137/72 | HR 87 | Ht 64.0 in | Wt 174.0 lb

## 2022-06-04 DIAGNOSIS — E7849 Other hyperlipidemia: Secondary | ICD-10-CM

## 2022-06-04 DIAGNOSIS — R7301 Impaired fasting glucose: Secondary | ICD-10-CM

## 2022-06-04 DIAGNOSIS — E038 Other specified hypothyroidism: Secondary | ICD-10-CM | POA: Diagnosis not present

## 2022-06-04 DIAGNOSIS — J4520 Mild intermittent asthma, uncomplicated: Secondary | ICD-10-CM

## 2022-06-04 DIAGNOSIS — E559 Vitamin D deficiency, unspecified: Secondary | ICD-10-CM

## 2022-06-04 DIAGNOSIS — I10 Essential (primary) hypertension: Secondary | ICD-10-CM | POA: Diagnosis not present

## 2022-06-04 NOTE — Assessment & Plan Note (Signed)
Controlled Encouraged to continue to take amlodipine 2.5 mg daily No changes to medication regimen today No refills needed Will order basic labs today as her last labs were ordered in December 2022

## 2022-06-04 NOTE — Assessment & Plan Note (Signed)
Controlled She reports minimal use of her albuterol inhaler No complaints voiced today

## 2022-06-04 NOTE — Progress Notes (Signed)
Established Patient Office Visit  Subjective:  [name], female    DOB: [birth year]  Age: 61 y.o. MRN: [id number]  CC:  Chief Complaint  Patient presents with   Follow-up    No other concerns at this time.    HPI Barbara Martin is a 61 y.o. female with past medical history of essential hypertension and asthma presents for f/u of  chronic medical conditions.  Hypertension: She takes amlodipine 2.5 mg daily.  She denies headaches, dizziness, blurred vision, chest pain, and palpitations.  She reports compliance with treatment regiment.  Asthma: She reports childhood asthma with no recent flareups.  She denies cough, shortness of breath, and frequent use of her albuterol inhaler.  Past Medical History:  Diagnosis Date   Asthma    Hyperlipidemia    Rectal bleeding    Ulcerative colitis [facility])     Past Surgical History:  Procedure Laterality Date   COLONOSCOPY N/A 09/04/2015   Procedure: COLONOSCOPY;  Surgeon: Rogene Houston, MD;  [facility];  Service: Endoscopy;  Laterality: N/A;  1:15 - moved to 12:15 - Ann to notify   [place]     rt wrist surgery x 2 for a varicose vein    Family History  Problem Relation Age of Onset   Hyperlipidemia Mother    Hypertension Mother    Dementia Mother    Arthritis Father    Cancer Father        leukemia   Hyperlipidemia Father    Asthma Brother    Heart disease Brother    Diabetes Brother        Type 1 diabetes   Heart disease Brother 58       valve issue    Social History   Socioeconomic History   Marital status: Married    Spouse name: Not on file   Number of children: 1   Years of education: Not on file   Highest education level: Not on file  Occupational History   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  Tobacco Use   Smoking status: Never   Smokeless tobacco: Never  Substance and Sexual Activity   Alcohol use: No   Drug use: No   Sexual  activity: Yes    Birth control/protection: Surgical  Other Topics Concern   Not on file  Social History Narrative   Not on file   Social Determinants of Health   Financial Resource Strain: Not on file  Food Insecurity: Not on file  Transportation Needs: Not on file  Physical Activity: Not on file  Stress: Not on file  Social Connections: Not on file  Intimate Partner Violence: Not on file    Outpatient Medications Prior to Visit  Medication Sig Dispense Refill   amLODipine (NORVASC) 2.5 MG tablet Take 1 tablet (2.5 mg total) by mouth daily. 90 tablet 0   cetirizine (ZYRTEC) 10 MG tablet Take 10 mg by mouth daily.     Cholecalciferol (VITAMIN D3) 20 MCG (800 UNIT) TABS Take by mouth.     Ibuprofen (ADVIL) 200 MG CAPS Take by mouth. 2 every 4 hours     mesalamine (APRISO) 0.375 g 24 hr capsule Take 1 capsule (0.375 g total) by mouth in the morning, at noon, in the evening, and at bedtime. 360 capsule 3   No facility-administered medications prior to visit.    No Known Allergies  ROS  Review of Systems  Constitutional:  Negative for fatigue and fever.  Respiratory:  Negative for chest tightness and shortness of breath.   Cardiovascular:  Negative for chest pain and palpitations.  Neurological:  Negative for dizziness and headaches.      Objective:    Physical Exam HENT:     Head: Normocephalic.  Cardiovascular:     Rate and Rhythm: Normal rate and regular rhythm.     Pulses: Normal pulses.     Heart sounds: Normal heart sounds.  Pulmonary:     Effort: Pulmonary effort is normal.     Breath sounds: Normal breath sounds.  Neurological:     Mental Status: She is alert.     BP 137/72 (BP Location: Left Arm)   Pulse 87   Ht _0  (1.626 m)   Wt 174 lb (78.9 kg)   LMP 02/01/2011   SpO2 97%   BMI 29.87 kg/m  Wt Readings from Last 3 Encounters:  06/04/22 174 lb (78.9 kg)  12/03/21 169 lb (76.7 kg)  09/04/21 173 lb (78.5 kg)    Lab Results  Component Value  Date   TSH 1.590 07/24/2021   Lab Results  Component Value Date   WBC 6.5 05/20/2021   HGB 13.8 05/20/2021   HCT 42.4 05/20/2021   MCV 88.7 05/20/2021   PLT 281 05/20/2021   Lab Results  Component Value Date   NA 140 09/04/2021   K 4.5 09/04/2021   CO2 23 09/04/2021   GLUCOSE 85 09/04/2021   BUN 13 09/04/2021   CREATININE 0.75 09/04/2021   BILITOT 0.4 09/04/2021   ALKPHOS 87 09/04/2021   AST 18 09/04/2021   ALT 14 09/04/2021   PROT 7.3 09/04/2021   ALBUMIN 4.6 09/04/2021   CALCIUM 9.5 09/04/2021   EGFR 91 09/04/2021   Lab Results  Component Value Date   CHOL 242 (H) 07/24/2021   Lab Results  Component Value Date   HDL 61 07/24/2021   Lab Results  Component Value Date   LDLCALC 164 (H) 07/24/2021   Lab Results  Component Value Date   TRIG 97 07/24/2021   Lab Results  Component Value Date   CHOLHDL 4.0 07/24/2021   Lab Results  Component Value Date   HGBA1C 5.7 (H) 07/24/2021      Assessment & Plan:   Problem List Items Addressed This Visit       Cardiovascular and Mediastinum   Hypertension - Primary    Controlled Encouraged to continue to take amlodipine 2.5 mg daily No changes to medication regimen today No refills needed Will order basic labs today as her last labs were ordered in December 2022        Respiratory   Asthma    Controlled She reports minimal use of her albuterol inhaler No complaints voiced today        Other   Hyperlipidemia   Relevant Orders   CBC with Differential/Platelet   CMP14+EGFR   Lipid Profile   Other Visit Diagnoses     Vitamin D deficiency       Relevant Orders   Vitamin D (25 hydroxy)   IFG (impaired fasting glucose)       Relevant Orders   Hemoglobin A1C   Other specified hypothyroidism       Relevant Orders   TSH + free T4       No orders of the defined types were placed in this encounter.   Follow-up: Return in about 1 week (  around 06/11/2022) for pap smear.    Alvira Monday,  FNP

## 2022-06-04 NOTE — Patient Instructions (Signed)
I appreciate the opportunity to provide care to you today!    Follow up:  1 week pap smear  Fasting labs: please stop by the lab during the week to get your blood drawn (CBC, CMP, TSH, Lipid profile, HgA1c, Vit D)     Please continue to a heart-healthy diet and increase your physical activities. Try to exercise for 35mns at least three times a week.      It was a pleasure to see you and I look forward to continuing to work together on your health and well-being. Please do not hesitate to call the office if you need care or have questions about your care.   Have a wonderful day and week. With Gratitude, GAlvira MondayMSN, FNP-BC

## 2022-06-12 DIAGNOSIS — E559 Vitamin D deficiency, unspecified: Secondary | ICD-10-CM | POA: Diagnosis not present

## 2022-06-12 DIAGNOSIS — R7301 Impaired fasting glucose: Secondary | ICD-10-CM | POA: Diagnosis not present

## 2022-06-12 DIAGNOSIS — E038 Other specified hypothyroidism: Secondary | ICD-10-CM | POA: Diagnosis not present

## 2022-06-12 DIAGNOSIS — E7849 Other hyperlipidemia: Secondary | ICD-10-CM | POA: Diagnosis not present

## 2022-06-13 LAB — CBC WITH DIFFERENTIAL/PLATELET
Basophils Absolute: 0 10*3/uL (ref 0.0–0.2)
Basos: 1 %
EOS (ABSOLUTE): 0.2 10*3/uL (ref 0.0–0.4)
Eos: 5 %
Hematocrit: 43.6 % (ref 34.0–46.6)
Hemoglobin: 14.2 g/dL (ref 11.1–15.9)
Immature Grans (Abs): 0 10*3/uL (ref 0.0–0.1)
Immature Granulocytes: 0 %
Lymphocytes Absolute: 1.6 10*3/uL (ref 0.7–3.1)
Lymphs: 30 %
MCH: 28.6 pg (ref 26.6–33.0)
MCHC: 32.6 g/dL (ref 31.5–35.7)
MCV: 88 fL (ref 79–97)
Monocytes Absolute: 0.5 10*3/uL (ref 0.1–0.9)
Monocytes: 10 %
Neutrophils Absolute: 3 10*3/uL (ref 1.4–7.0)
Neutrophils: 54 %
Platelets: 317 10*3/uL (ref 150–450)
RBC: 4.96 x10E6/uL (ref 3.77–5.28)
RDW: 12.6 % (ref 11.7–15.4)
WBC: 5.3 10*3/uL (ref 3.4–10.8)

## 2022-06-13 LAB — CMP14+EGFR
ALT: 19 IU/L (ref 0–32)
AST: 15 IU/L (ref 0–40)
Albumin/Globulin Ratio: 1.7 (ref 1.2–2.2)
Albumin: 4.3 g/dL (ref 3.9–4.9)
Alkaline Phosphatase: 89 IU/L (ref 44–121)
BUN/Creatinine Ratio: 15 (ref 12–28)
BUN: 13 mg/dL (ref 8–27)
Bilirubin Total: 0.4 mg/dL (ref 0.0–1.2)
CO2: 24 mmol/L (ref 20–29)
Calcium: 9.4 mg/dL (ref 8.7–10.3)
Chloride: 102 mmol/L (ref 96–106)
Creatinine, Ser: 0.85 mg/dL (ref 0.57–1.00)
Globulin, Total: 2.5 g/dL (ref 1.5–4.5)
Glucose: 89 mg/dL (ref 70–99)
Potassium: 4.3 mmol/L (ref 3.5–5.2)
Sodium: 139 mmol/L (ref 134–144)
Total Protein: 6.8 g/dL (ref 6.0–8.5)
eGFR: 77 mL/min/{1.73_m2} (ref >59)

## 2022-06-13 LAB — LIPID PANEL
Chol/HDL Ratio: 4.4 ratio (ref 0.0–4.4)
Cholesterol, Total: 244 mg/dL — ABNORMAL HIGH (ref 100–199)
HDL: 55 mg/dL (ref >39)
LDL Chol Calc (NIH): 169 mg/dL — ABNORMAL HIGH (ref 0–99)
Triglycerides: 112 mg/dL (ref 0–149)
VLDL Cholesterol Cal: 20 mg/dL (ref 5–40)

## 2022-06-13 LAB — HEMOGLOBIN A1C
Est. average glucose Bld gHb Est-mCnc: 117 mg/dL
Hgb A1c MFr Bld: 5.7 % — ABNORMAL HIGH (ref 4.8–5.6)

## 2022-06-13 LAB — TSH+FREE T4
Free T4: 1.25 ng/dL (ref 0.82–1.77)
TSH: 1.39 u[IU]/mL (ref 0.450–4.500)

## 2022-06-13 LAB — VITAMIN D 25 HYDROXY (VIT D DEFICIENCY, FRACTURES): Vit D, 25-Hydroxy: 34.1 ng/mL (ref 30.0–100.0)

## 2022-06-15 ENCOUNTER — Ambulatory Visit: Payer: 59 | Admitting: Family Medicine

## 2022-06-15 ENCOUNTER — Encounter: Payer: Self-pay | Admitting: Family Medicine

## 2022-06-15 ENCOUNTER — Other Ambulatory Visit (HOSPITAL_COMMUNITY)
Admission: RE | Admit: 2022-06-15 | Discharge: 2022-06-15 | Disposition: A | Discharge status: Home / Self Care | Payer: 59 | Source: Ambulatory Visit | Attending: Family Medicine | Admitting: Family Medicine

## 2022-06-15 ENCOUNTER — Other Ambulatory Visit (HOSPITAL_COMMUNITY): Payer: Self-pay

## 2022-06-15 VITALS — BP 138/82 | HR 88 | Ht 64.0 in | Wt 173.0 lb

## 2022-06-15 DIAGNOSIS — E782 Mixed hyperlipidemia: Secondary | ICD-10-CM

## 2022-06-15 DIAGNOSIS — Z124 Encounter for screening for malignant neoplasm of cervix: Secondary | ICD-10-CM

## 2022-06-15 DIAGNOSIS — E785 Hyperlipidemia, unspecified: Secondary | ICD-10-CM

## 2022-06-15 MED ORDER — ROSUVASTATIN CALCIUM 10 MG PO TABS
10.0000 mg | ORAL_TABLET | Freq: Every day | ORAL | 3 refills | Status: DC
Start: 2022-06-15 — End: 2023-06-01
  Filled 2022-06-15: qty 90, 90d supply, fill #0
  Filled 2022-09-14: qty 90, 90d supply, fill #1
  Filled 2022-12-13: qty 90, 90d supply, fill #2
  Filled 2022-12-14: qty 90, 90d supply, fill #0
  Filled 2023-03-15: qty 90, 90d supply, fill #1

## 2022-06-15 NOTE — Progress Notes (Signed)
I  reviewed with patient during her appointment today.

## 2022-06-15 NOTE — Assessment & Plan Note (Addendum)
Pap smear completed - According to the [facility], cytology and HPV co-testing (preferred) every 5 years or cytology alone (acceptable) every 3 years. - Pap due 2026/2028

## 2022-06-15 NOTE — Progress Notes (Signed)
Established Patient Office Visit  Subjective:  [name], female    DOB: 04/27/1961  Age: 61 y.o. MRN: [id number]  CC:  Chief Complaint  Patient presents with   Gynecologic Exam    Will be having a pap done today.    Results    Would like lab results reviewed, came on Friday to have labs drawn.     HPI Barbara Martin is a 61 y.o. female with past medical history of hypertension presents for Pap smear.    Past Medical History:  Diagnosis Date   Asthma    Hyperlipidemia    Rectal bleeding    Ulcerative colitis [facility])     Past Surgical History:  Procedure Laterality Date   COLONOSCOPY N/A 09/04/2015   Procedure: COLONOSCOPY;  Surgeon: Rogene Houston, MD;  [facility];  Service: Endoscopy;  Laterality: N/A;  1:15 - moved to 12:15 - Ann to notify   [place]     rt wrist surgery x 2 for a varicose vein    Family History  Problem Relation Age of Onset   Hyperlipidemia Mother    Hypertension Mother    Dementia Mother    Arthritis Father    Cancer Father        leukemia   Hyperlipidemia Father    Asthma Brother    Heart disease Brother    Diabetes Brother        Type 1 diabetes   Heart disease Brother 42       valve issue    Social History   Socioeconomic History   Marital status: Married    Spouse name: Not on file   Number of children: 1   Years of education: Not on file   Highest education level: Not on file  Occupational History   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  Tobacco Use   Smoking status: Never   Smokeless tobacco: Never  Substance and Sexual Activity   Alcohol use: No   Drug use: No   Sexual activity: Yes    Birth control/protection: Surgical  Other Topics Concern   Not on file  Social History Narrative   Not on file   Social Determinants of Health   Financial Resource Strain: Not on file  Food Insecurity: Not on file  Transportation Needs: Not on file   Physical Activity: Not on file  Stress: Not on file  Social Connections: Not on file  Intimate Partner Violence: Not on file    Outpatient Medications Prior to Visit  Medication Sig Dispense Refill   amLODipine (NORVASC) 2.5 MG tablet Take 1 tablet (2.5 mg total) by mouth daily. 90 tablet 0   cetirizine (ZYRTEC) 10 MG tablet Take 10 mg by mouth daily.     Cholecalciferol (VITAMIN D3) 20 MCG (800 UNIT) TABS Take by mouth.     Ibuprofen (ADVIL) 200 MG CAPS Take by mouth. 2 every 4 hours     mesalamine (APRISO) 0.375 g 24 hr capsule Take 1 capsule (0.375 g total) by mouth in the morning, at noon, in the evening, and at bedtime. 360 capsule 3   No facility-administered medications prior to visit.    No Known Allergies  ROS Review of Systems  Constitutional:  Negative for fatigue and fever.  Eyes:  Negative for photophobia and visual disturbance.  Cardiovascular:  Negative for chest pain and palpitations.  Genitourinary:  Negative  for decreased urine volume, pelvic pain, urgency, vaginal bleeding, vaginal discharge and vaginal pain.      Objective:    Physical Exam Cardiovascular:     Rate and Rhythm: Normal rate and regular rhythm.     Pulses: Normal pulses.     Heart sounds: Normal heart sounds.  Pulmonary:     Effort: Pulmonary effort is normal.     Breath sounds: Normal breath sounds.  Genitourinary:    Exam position: Lithotomy position.     Tanner stage (genital): 5.     Labia:        Right: No tenderness.        Left: No tenderness.      Comments: Vaginal wall: pink and rugated, smooth and non-tender; absence of lesions, edema, and erythema. Labia Majora and Minora: present bilaterally, moist, soft tissue, and homogeneous; free of edema and ulcerations. Clitoris is anatomically present, above the urethral, and free of lesions, masses, and ulceration.  Neurological:     Mental Status: She is alert.     BP 138/82 (BP Location: Left Arm)   Pulse 88   Ht 5' 4"  (1.626 m)   Wt 173 lb 0.6 oz (78.5 kg)   LMP 02/01/2011   SpO2 99%   BMI 29.70 kg/m  Wt Readings from Last 3 Encounters:  06/15/22 173 lb 0.6 oz (78.5 kg)  06/04/22 174 lb (78.9 kg)  12/03/21 169 lb (76.7 kg)    Lab Results  Component Value Date   TSH 1.390 06/12/2022   Lab Results  Component Value Date   WBC 5.3 06/12/2022   HGB 14.2 06/12/2022   HCT 43.6 06/12/2022   MCV 88 06/12/2022   PLT 317 06/12/2022   Lab Results  Component Value Date   NA 139 06/12/2022   K 4.3 06/12/2022   CO2 24 06/12/2022   GLUCOSE 89 06/12/2022   BUN 13 06/12/2022   CREATININE 0.85 06/12/2022   BILITOT 0.4 06/12/2022   ALKPHOS 89 06/12/2022   AST 15 06/12/2022   ALT 19 06/12/2022   PROT 6.8 06/12/2022   ALBUMIN 4.3 06/12/2022   CALCIUM 9.4 06/12/2022   EGFR 77 06/12/2022   Lab Results  Component Value Date   CHOL 244 (H) 06/12/2022   Lab Results  Component Value Date   HDL 55 06/12/2022   Lab Results  Component Value Date   LDLCALC 169 (H) 06/12/2022   Lab Results  Component Value Date   TRIG 112 06/12/2022   Lab Results  Component Value Date   CHOLHDL 4.4 06/12/2022   Lab Results  Component Value Date   HGBA1C 5.7 (H) 06/12/2022      Assessment & Plan:   Problem List Items Addressed This Visit       Other   Hyperlipidemia    Labs reviewed with patient Started patient on rosuvastatin 10 mg daily for her cholesterol We will reassess her cholesterol levels in 3 months       Relevant Medications   rosuvastatin (CRESTOR) 10 MG tablet   Pap smear for cervical cancer screening - Primary    Pap smear completed - According to the [facility], cytology and HPV co-testing (preferred) every 5 years or cytology alone (acceptable) every 3 years. - Pap due 2026/2028       Other Visit Diagnoses     Cervical cancer screening       Relevant Orders   Cytology - PAP   Hyperlipidemia LDL goal <100  Relevant Medications   rosuvastatin (CRESTOR)  10 MG tablet       Meds ordered this encounter  Medications   rosuvastatin (CRESTOR) 10 MG tablet    Sig: Take 1 tablet (10 mg total) by mouth daily.    Dispense:  90 tablet    Refill:  3    Follow-up: Return in about 3 months (around 09/15/2022) for HLP.    Alvira Monday, FNP

## 2022-06-15 NOTE — Assessment & Plan Note (Signed)
Labs reviewed with patient Started patient on rosuvastatin 10 mg daily for her cholesterol We will reassess her cholesterol levels in 3 months

## 2022-06-15 NOTE — Patient Instructions (Signed)
I appreciate the opportunity to provide care to you today!    Follow up:  3 months  Please pick up your prescription at the pharmacy and start therapy I recommend low carbs and fat diet with increased physical activities I will reassess your lipid panel in 3 months Please ask for an earlier appointment so that you can get fasting the day of your appointment We will contact you with the result of your Pap smear today    Please continue to a heart-healthy diet and increase your physical activities. Try to exercise for 77mns at least three times a week.      It was a pleasure to see you and I look forward to continuing to work together on your health and well-being. Please do not hesitate to call the office if you need care or have questions about your care.   Have a wonderful day and week. With Gratitude, GAlvira MondayMSN, FNP-BC

## 2022-06-16 LAB — CYTOLOGY - PAP
Comment: NEGATIVE
Diagnosis: NEGATIVE
High risk HPV: NEGATIVE

## 2022-06-16 NOTE — Progress Notes (Signed)
Please inform the patient that her pap smear was negative for abnormal growth or malignancy on her cervix.

## 2022-07-30 ENCOUNTER — Other Ambulatory Visit: Payer: Self-pay | Admitting: Nurse Practitioner

## 2022-07-30 ENCOUNTER — Other Ambulatory Visit: Payer: Self-pay

## 2022-07-30 ENCOUNTER — Other Ambulatory Visit (INDEPENDENT_AMBULATORY_CARE_PROVIDER_SITE_OTHER): Payer: Self-pay | Admitting: Gastroenterology

## 2022-07-30 ENCOUNTER — Other Ambulatory Visit (HOSPITAL_COMMUNITY): Payer: Self-pay

## 2022-07-30 DIAGNOSIS — K5120 Ulcerative (chronic) proctitis without complications: Secondary | ICD-10-CM

## 2022-07-30 MED ORDER — AMLODIPINE BESYLATE 2.5 MG PO TABS
2.5000 mg | ORAL_TABLET | Freq: Every day | ORAL | 0 refills | Status: DC
Start: 2022-07-30 — End: 2022-10-16
  Filled 2022-07-30: qty 90, 90d supply, fill #0

## 2022-07-30 NOTE — Telephone Encounter (Signed)
Not seen in over 1 year. Sept 2022. Please schedule and then will send to provider to see if enough med can be sent in til her appt. Thanks

## 2022-08-03 ENCOUNTER — Other Ambulatory Visit (HOSPITAL_COMMUNITY): Payer: Self-pay

## 2022-08-03 MED ORDER — MESALAMINE ER 0.375 G PO CP24
375.0000 mg | ORAL_CAPSULE | Freq: Four times a day (QID) | ORAL | 3 refills | Status: DC
  Filled 2022-08-03: qty 360, 90d supply, fill #0

## 2022-08-03 NOTE — Telephone Encounter (Signed)
Last seen sept 2022 and next appt is march 2024

## 2022-08-22 DIAGNOSIS — Z6828 Body mass index (BMI) 28.0-28.9, adult: Secondary | ICD-10-CM | POA: Diagnosis not present

## 2022-08-22 DIAGNOSIS — E663 Overweight: Secondary | ICD-10-CM | POA: Diagnosis not present

## 2022-08-22 DIAGNOSIS — J069 Acute upper respiratory infection, unspecified: Secondary | ICD-10-CM | POA: Diagnosis not present

## 2022-09-14 ENCOUNTER — Other Ambulatory Visit (HOSPITAL_COMMUNITY): Payer: Self-pay

## 2022-09-15 ENCOUNTER — Encounter: Payer: Self-pay | Admitting: Family Medicine

## 2022-09-15 ENCOUNTER — Ambulatory Visit: Payer: Commercial Managed Care - PPO | Admitting: Family Medicine

## 2022-09-15 ENCOUNTER — Other Ambulatory Visit (HOSPITAL_COMMUNITY): Payer: Self-pay | Admitting: Family Medicine

## 2022-09-15 VITALS — BP 152/82 | HR 83 | Ht 64.0 in | Wt 167.0 lb

## 2022-09-15 DIAGNOSIS — R7301 Impaired fasting glucose: Secondary | ICD-10-CM

## 2022-09-15 DIAGNOSIS — E559 Vitamin D deficiency, unspecified: Secondary | ICD-10-CM

## 2022-09-15 DIAGNOSIS — E038 Other specified hypothyroidism: Secondary | ICD-10-CM

## 2022-09-15 DIAGNOSIS — I10 Essential (primary) hypertension: Secondary | ICD-10-CM

## 2022-09-15 DIAGNOSIS — E785 Hyperlipidemia, unspecified: Secondary | ICD-10-CM | POA: Diagnosis not present

## 2022-09-15 DIAGNOSIS — E782 Mixed hyperlipidemia: Secondary | ICD-10-CM

## 2022-09-15 DIAGNOSIS — Z1231 Encounter for screening mammogram for malignant neoplasm of breast: Secondary | ICD-10-CM

## 2022-09-15 NOTE — Patient Instructions (Signed)
I appreciate the opportunity to provide care to you today!    Follow up:  2 weeks for BP  Labs: please stop by the lab today to get your blood drawn (CBC, CMP, TSH, Lipid profile, HgA1c, Vit D)  Please check your blood pressure at home daily and bring the readings with you to your next appointment I recommend low-sodium diet with increased physical activity  Physical activity helps: Lower your blood glucose, improve your heart health, lower your blood pressure and cholesterol, burn calories to help manage her weight, gave you energy, lower stress, and improve his sleep.  The American diabetes Association (ADA) recommends being active for 2-1/2 hours (150 minutes) or more week.  Exercise for 30 minutes, 5 days a week (150 minutes total)    Please continue to a heart-healthy diet and increase your physical activities. Try to exercise for 29mns at least five times a week.      It was a pleasure to see you and I look forward to continuing to work together on your health and well-being. Please do not hesitate to call the office if you need care or have questions about your care.   Have a wonderful day and week. With Gratitude, GAlvira MondayMSN, FNP-BC

## 2022-09-15 NOTE — Assessment & Plan Note (Signed)
She takes rosuvastatin 10 mg daily Denies muscle aches and pain Will assess lipid panel today Lab Results  Component Value Date   CHOL 244 (H) 06/12/2022   HDL 55 06/12/2022   LDLCALC 169 (H) 06/12/2022   TRIG 112 06/12/2022   CHOLHDL 4.4 06/12/2022

## 2022-09-15 NOTE — Progress Notes (Signed)
Established Patient Office Visit  Subjective:  [name], female    DOB: 04-18-1961  Age: 62 y.o. MRN: [id number]  CC:  Chief Complaint  Patient presents with   Follow-up    3 month f/u.     HPI Barbara Martin is a 62 y.o. female with past medical history of hypertension hyperlipidemia presents for f/u of  chronic medical conditions.  For the details of today's visit, please refer to the assessment and plan.    Past Medical History:  Diagnosis Date   Asthma    Hyperlipidemia    Rectal bleeding    Ulcerative colitis [facility])     Past Surgical History:  Procedure Laterality Date   COLONOSCOPY N/A 09/04/2015   Procedure: COLONOSCOPY;  Surgeon: Rogene Houston, MD;  [facility];  Service: Endoscopy;  Laterality: N/A;  1:15 - moved to 12:15 - Ann to notify   [place]     rt wrist surgery x 2 for a varicose vein    Family History  Problem Relation Age of Onset   Hyperlipidemia Mother    Hypertension Mother    Dementia Mother    Arthritis Father    Cancer Father        leukemia   Hyperlipidemia Father    Asthma Brother    Heart disease Brother    Diabetes Brother        Type 1 diabetes   Heart disease Brother 78       valve issue    Social History   Socioeconomic History   Marital status: Married    Spouse name: Not on file   Number of children: 1   Years of education: Not on file   Highest education level: Not on file  Occupational History   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  Tobacco Use   Smoking status: Never   Smokeless tobacco: Never  Substance and Sexual Activity   Alcohol use: No   Drug use: No   Sexual activity: Yes    Birth control/protection: Surgical  Other Topics Concern   Not on file  Social History Narrative   Not on file   Social Determinants of Health   Financial Resource Strain: Not on file  Food Insecurity: Not on file  Transportation Needs: Not on file   Physical Activity: Not on file  Stress: Not on file  Social Connections: Not on file  Intimate Partner Violence: Not on file    Outpatient Medications Prior to Visit  Medication Sig Dispense Refill   amLODipine (NORVASC) 2.5 MG tablet Take 1 tablet (2.5 mg total) by mouth daily. 90 tablet 0   cetirizine (ZYRTEC) 10 MG tablet Take 10 mg by mouth daily.     Cholecalciferol (VITAMIN D3) 20 MCG (800 UNIT) TABS Take by mouth.     Ibuprofen (ADVIL) 200 MG CAPS Take by mouth. 2 every 4 hours     mesalamine (APRISO) 0.375 g 24 hr capsule Take 1 capsule (0.375 g total) by mouth in the morning, at noon, in the evening, and at bedtime. 360 capsule 3   rosuvastatin (CRESTOR) 10 MG tablet Take 1 tablet (10 mg total) by mouth daily. 90 tablet 3   No facility-administered medications prior to visit.    No Known Allergies  ROS Review of Systems  Constitutional:  Negative for chills and fever.  Eyes:  Negative for visual disturbance.  Respiratory:  Negative for chest tightness and shortness of breath.   Neurological:  Negative for dizziness and headaches.      Objective:    Physical Exam HENT:     Head: Normocephalic.     Mouth/Throat:     Mouth: Mucous membranes are moist.  Cardiovascular:     Rate and Rhythm: Normal rate.     Heart sounds: Normal heart sounds.  Pulmonary:     Effort: Pulmonary effort is normal.     Breath sounds: Normal breath sounds.  Neurological:     Mental Status: She is alert.     BP (!) 152/82 (BP Location: Left Arm)   Pulse 83   Ht '5\' 4"'$  (1.626 m)   Wt 167 lb (75.8 kg)   LMP 02/01/2011   SpO2 98%   BMI 28.67 kg/m  Wt Readings from Last 3 Encounters:  09/15/22 167 lb (75.8 kg)  06/15/22 173 lb 0.6 oz (78.5 kg)  06/04/22 174 lb (78.9 kg)    Lab Results  Component Value Date   TSH 1.390 06/12/2022   Lab Results  Component Value Date   WBC 5.3 06/12/2022   HGB 14.2 06/12/2022   HCT 43.6 06/12/2022   MCV 88 06/12/2022   PLT 317 06/12/2022    Lab Results  Component Value Date   NA 139 06/12/2022   K 4.3 06/12/2022   CO2 24 06/12/2022   GLUCOSE 89 06/12/2022   BUN 13 06/12/2022   CREATININE 0.85 06/12/2022   BILITOT 0.4 06/12/2022   ALKPHOS 89 06/12/2022   AST 15 06/12/2022   ALT 19 06/12/2022   PROT 6.8 06/12/2022   ALBUMIN 4.3 06/12/2022   CALCIUM 9.4 06/12/2022   EGFR 77 06/12/2022   Lab Results  Component Value Date   CHOL 244 (H) 06/12/2022   Lab Results  Component Value Date   HDL 55 06/12/2022   Lab Results  Component Value Date   LDLCALC 169 (H) 06/12/2022   Lab Results  Component Value Date   TRIG 112 06/12/2022   Lab Results  Component Value Date   CHOLHDL 4.4 06/12/2022   Lab Results  Component Value Date   HGBA1C 5.7 (H) 06/12/2022      Assessment & Plan:  Mixed hyperlipidemia Assessment & Plan: She takes rosuvastatin 10 mg daily Denies muscle aches and pain Will assess lipid panel today Lab Results  Component Value Date   CHOL 244 (H) 06/12/2022   HDL 55 06/12/2022   LDLCALC 169 (H) 06/12/2022   TRIG 112 06/12/2022   CHOLHDL 4.4 06/12/2022      Primary hypertension Assessment & Plan: Uncontrolled She takes amlodipine 2.5 mg daily She reports normal ambulatory readings Instructed patient to check her blood pressure daily at home and bring ambulatory readings with her at her next appointment Encouraged low-sodium diet with increased physical activity Will follow-up on BP in 2 weeks BP Readings from Last 3 Encounters:  09/15/22 (!) 152/82  06/15/22 138/82  06/04/22 137/72     Orders: -     CMP14+EGFR -     CBC with Differential/Platelet  Hyperlipidemia LDL goal <100 -     Lipid panel  Vitamin D deficiency -     VITAMIN D 25 Hydroxy (Vit-D Deficiency, Fractures)  Other specified hypothyroidism -     TSH + free T4  IFG (impaired fasting glucose) -     Hemoglobin A1c    Follow-up: Return in about 2 weeks (around 09/29/2022) for BP.  Alvira Monday,  FNP

## 2022-09-15 NOTE — Assessment & Plan Note (Signed)
Uncontrolled She takes amlodipine 2.5 mg daily She reports normal ambulatory readings Instructed patient to check her blood pressure daily at home and bring ambulatory readings with her at her next appointment Encouraged low-sodium diet with increased physical activity Will follow-up on BP in 2 weeks BP Readings from Last 3 Encounters:  09/15/22 (!) 152/82  06/15/22 138/82  06/04/22 137/72

## 2022-09-16 LAB — CMP14+EGFR
ALT: 19 IU/L (ref 0–32)
AST: 18 IU/L (ref 0–40)
Albumin/Globulin Ratio: 1.8 (ref 1.2–2.2)
Albumin: 4.6 g/dL (ref 3.9–4.9)
Alkaline Phosphatase: 89 IU/L (ref 44–121)
BUN/Creatinine Ratio: 17 (ref 12–28)
BUN: 13 mg/dL (ref 8–27)
Bilirubin Total: 0.4 mg/dL (ref 0.0–1.2)
CO2: 21 mmol/L (ref 20–29)
Calcium: 9.6 mg/dL (ref 8.7–10.3)
Chloride: 102 mmol/L (ref 96–106)
Creatinine, Ser: 0.75 mg/dL (ref 0.57–1.00)
Globulin, Total: 2.6 g/dL (ref 1.5–4.5)
Glucose: 90 mg/dL (ref 70–99)
Potassium: 4.6 mmol/L (ref 3.5–5.2)
Sodium: 140 mmol/L (ref 134–144)
Total Protein: 7.2 g/dL (ref 6.0–8.5)
eGFR: 90 mL/min/{1.73_m2} (ref >59)

## 2022-09-16 LAB — CBC WITH DIFFERENTIAL/PLATELET
Basophils Absolute: 0 10*3/uL (ref 0.0–0.2)
Basos: 1 %
EOS (ABSOLUTE): 0.6 10*3/uL — ABNORMAL HIGH (ref 0.0–0.4)
Eos: 9 %
Hematocrit: 42.4 % (ref 34.0–46.6)
Hemoglobin: 13.6 g/dL (ref 11.1–15.9)
Immature Grans (Abs): 0 10*3/uL (ref 0.0–0.1)
Immature Granulocytes: 0 %
Lymphocytes Absolute: 1.5 10*3/uL (ref 0.7–3.1)
Lymphs: 23 %
MCH: 28.1 pg (ref 26.6–33.0)
MCHC: 32.1 g/dL (ref 31.5–35.7)
MCV: 88 fL (ref 79–97)
Monocytes Absolute: 0.5 10*3/uL (ref 0.1–0.9)
Monocytes: 8 %
Neutrophils Absolute: 4.1 10*3/uL (ref 1.4–7.0)
Neutrophils: 59 %
Platelets: 316 10*3/uL (ref 150–450)
RBC: 4.84 x10E6/uL (ref 3.77–5.28)
RDW: 12.6 % (ref 11.7–15.4)
WBC: 6.8 10*3/uL (ref 3.4–10.8)

## 2022-09-16 LAB — HEMOGLOBIN A1C
Est. average glucose Bld gHb Est-mCnc: 120 mg/dL
Hgb A1c MFr Bld: 5.8 % — ABNORMAL HIGH (ref 4.8–5.6)

## 2022-09-16 LAB — LIPID PANEL
Chol/HDL Ratio: 2.5 ratio (ref 0.0–4.4)
Cholesterol, Total: 155 mg/dL (ref 100–199)
HDL: 62 mg/dL (ref >39)
LDL Chol Calc (NIH): 77 mg/dL (ref 0–99)
Triglycerides: 82 mg/dL (ref 0–149)
VLDL Cholesterol Cal: 16 mg/dL (ref 5–40)

## 2022-09-16 LAB — TSH+FREE T4
Free T4: 1.18 ng/dL (ref 0.82–1.77)
TSH: 1.51 u[IU]/mL (ref 0.450–4.500)

## 2022-09-16 LAB — VITAMIN D 25 HYDROXY (VIT D DEFICIENCY, FRACTURES): Vit D, 25-Hydroxy: 40.3 ng/mL (ref 30.0–100.0)

## 2022-09-16 NOTE — Progress Notes (Signed)
Your labs indicate that you are prediabetic.  I recommend avoiding simple carbohydrates including cakes, sweet desserts, ice cream, soda (diet or regular), sweet tea, candies, chips, cookies, store-bought juices, alcohol in excess of 1-2 drinks a day, lemonade, artificial sweeteners, donuts, coffee creamers, and sugar-free products.  All other labs are stable

## 2022-10-02 ENCOUNTER — Encounter: Payer: Self-pay | Admitting: Family Medicine

## 2022-10-02 ENCOUNTER — Ambulatory Visit: Payer: Commercial Managed Care - PPO | Admitting: Family Medicine

## 2022-10-02 VITALS — BP 153/86 | HR 69 | Resp 16 | Ht 64.0 in | Wt 165.0 lb

## 2022-10-02 DIAGNOSIS — I10 Essential (primary) hypertension: Secondary | ICD-10-CM | POA: Diagnosis not present

## 2022-10-02 NOTE — Assessment & Plan Note (Signed)
She takes amlodipine 2.5 mg daily She denies headaches, dizziness, blurred vision, chest pain, palpitation Ambulatory readings in the [id number] systolic and [id number] diastolic She reports being nervous at the doctor's office Elevated BP likely due to whitecoat syndrome No changes to treatment regimen today Encouraged to check her blood pressure daily at home and bring ambulatory readings with her at her next appointment Encourage low-sodium diet with increased physical activity

## 2022-10-02 NOTE — Patient Instructions (Addendum)
I appreciate the opportunity to provide care to you today!    Follow up:  3 months  I recommend low sodium diet with increase physical activity Please continue to check your BP daily at home and bring the log to your next appt    Please continue to a heart-healthy diet and increase your physical activities. Try to exercise for 56mns at least five times a week.   Physical activity helps: Lower your blood glucose, improve your heart health, lower your blood pressure and cholesterol, burn calories to help manage her weight, gave you energy, lower stress, and improve his sleep.  The American diabetes Association (ADA) recommends being active for 2-1/2 hours (150 minutes) or more week.  Exercise for 30 minutes, 5 days a week (150 minutes total)    It was a pleasure to see you and I look forward to continuing to work together on your health and well-being. Please do not hesitate to call the office if you need care or have questions about your care.   Have a wonderful day and week. With Gratitude, GAlvira MondayMSN, FNP-BC

## 2022-10-02 NOTE — Progress Notes (Signed)
Established Patient Office Visit  Subjective:  [name], female    DOB: 08/02/1961  Age: 62 y.o. MRN: [id number]  CC:  Chief Complaint  Patient presents with   Hypertension    2 week follow up     HPI Barbara Martin is a 62 y.o. female with past medical history of hypertension, asthma, and ulcerative colitis presents for f/u of  chronic medical conditions. For the details of today's visit, please refer to the assessment and plan. For the details of today's visit, please refer to the assessment and plan.        Past Medical History:  Diagnosis Date   Asthma    Hyperlipidemia    Rectal bleeding    Ulcerative colitis [facility])     Past Surgical History:  Procedure Laterality Date   COLONOSCOPY N/A 09/04/2015   Procedure: COLONOSCOPY;  Surgeon: Rogene Houston, MD;  [facility];  Service: Endoscopy;  Laterality: N/A;  1:15 - moved to 12:15 - Ann to notify   [place]     rt wrist surgery x 2 for a varicose vein    Family History  Problem Relation Age of Onset   Hyperlipidemia Mother    Hypertension Mother    Dementia Mother    Arthritis Father    Cancer Father        leukemia   Hyperlipidemia Father    Asthma Brother    Heart disease Brother    Diabetes Brother        Type 1 diabetes   Heart disease Brother 59       valve issue    Social History   Socioeconomic History   Marital status: Married    Spouse name: Not on file   Number of children: 1   Years of education: Not on file   Highest education level: Not on file  Occupational History   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  Tobacco Use   Smoking status: Never   Smokeless tobacco: Never  Substance and Sexual Activity   Alcohol use: No   Drug use: No   Sexual activity: Yes    Birth control/protection: Surgical  Other Topics Concern   Not on file  Social History Narrative   Not on file   Social Determinants of Health    Financial Resource Strain: Not on file  Food Insecurity: Not on file  Transportation Needs: Not on file  Physical Activity: Not on file  Stress: Not on file  Social Connections: Not on file  Intimate Partner Violence: Not on file    Outpatient Medications Prior to Visit  Medication Sig Dispense Refill   amLODipine (NORVASC) 2.5 MG tablet Take 1 tablet (2.5 mg total) by mouth daily. 90 tablet 0   cetirizine (ZYRTEC) 10 MG tablet Take 10 mg by mouth daily.     Cholecalciferol (VITAMIN D3) 20 MCG (800 UNIT) TABS Take by mouth.     Ibuprofen (ADVIL) 200 MG CAPS Take by mouth. 2 every 4 hours     mesalamine (APRISO) 0.375 g 24 hr capsule Take 1 capsule (0.375 g total) by mouth in the morning, at noon, in the evening, and at bedtime. 360 capsule 3   rosuvastatin (CRESTOR) 10 MG tablet Take 1 tablet (10 mg total) by mouth daily. 90 tablet 3   No facility-administered medications prior to visit.    No Known Allergies  ROS Review of Systems  Constitutional:  Negative for chills and fever.  Eyes:  Negative for visual disturbance.  Respiratory:  Negative for chest tightness and shortness of breath.   Neurological:  Negative for dizziness and headaches.      Objective:    Physical Exam HENT:     Head: Normocephalic.     Mouth/Throat:     Mouth: Mucous membranes are moist.  Cardiovascular:     Rate and Rhythm: Normal rate.     Heart sounds: Normal heart sounds.  Pulmonary:     Effort: Pulmonary effort is normal.     Breath sounds: Normal breath sounds.  Neurological:     Mental Status: She is alert.     BP (!) 153/86   Pulse 69   Resp 16   Ht 5' 4"$  (1.626 m)   Wt 165 lb (74.8 kg)   LMP 02/01/2011   SpO2 98%   BMI 28.32 kg/m  Wt Readings from Last 3 Encounters:  10/02/22 165 lb (74.8 kg)  09/15/22 167 lb (75.8 kg)  06/15/22 173 lb 0.6 oz (78.5 kg)    Lab Results  Component Value Date   TSH 1.510 09/15/2022   Lab Results  Component Value Date   WBC 6.8  09/15/2022   HGB 13.6 09/15/2022   HCT 42.4 09/15/2022   MCV 88 09/15/2022   PLT 316 09/15/2022   Lab Results  Component Value Date   NA 140 09/15/2022   K 4.6 09/15/2022   CO2 21 09/15/2022   GLUCOSE 90 09/15/2022   BUN 13 09/15/2022   CREATININE 0.75 09/15/2022   BILITOT 0.4 09/15/2022   ALKPHOS 89 09/15/2022   AST 18 09/15/2022   ALT 19 09/15/2022   PROT 7.2 09/15/2022   ALBUMIN 4.6 09/15/2022   CALCIUM 9.6 09/15/2022   EGFR 90 09/15/2022   Lab Results  Component Value Date   CHOL 155 09/15/2022   Lab Results  Component Value Date   HDL 62 09/15/2022   Lab Results  Component Value Date   LDLCALC 77 09/15/2022   Lab Results  Component Value Date   TRIG 82 09/15/2022   Lab Results  Component Value Date   CHOLHDL 2.5 09/15/2022   Lab Results  Component Value Date   HGBA1C 5.8 (H) 09/15/2022      Assessment & Plan:  Primary hypertension Assessment & Plan: She takes amlodipine 2.5 mg daily She denies headaches, dizziness, blurred vision, chest pain, palpitation Ambulatory readings in the [id number] systolic and [id number] diastolic She reports being nervous at the doctor's office Elevated BP likely due to whitecoat syndrome No changes to treatment regimen today Encouraged to check her blood pressure daily at home and bring ambulatory readings with her at her next appointment Encourage low-sodium diet with increased physical activity     Follow-up: Return in about 3 months (around 12/31/2022).   Alvira Monday, FNP

## 2022-10-07 ENCOUNTER — Ambulatory Visit (HOSPITAL_COMMUNITY)
Admission: RE | Admit: 2022-10-07 | Discharge: 2022-10-07 | Disposition: A | Discharge status: Home / Self Care | Payer: Commercial Managed Care - PPO | Source: Ambulatory Visit | Attending: Family Medicine | Admitting: Family Medicine

## 2022-10-07 DIAGNOSIS — Z1231 Encounter for screening mammogram for malignant neoplasm of breast: Secondary | ICD-10-CM | POA: Diagnosis not present

## 2022-10-08 ENCOUNTER — Other Ambulatory Visit (HOSPITAL_COMMUNITY): Payer: Self-pay | Admitting: Family Medicine

## 2022-10-08 DIAGNOSIS — R928 Other abnormal and inconclusive findings on diagnostic imaging of breast: Secondary | ICD-10-CM

## 2022-10-16 ENCOUNTER — Other Ambulatory Visit (HOSPITAL_BASED_OUTPATIENT_CLINIC_OR_DEPARTMENT_OTHER): Payer: Self-pay

## 2022-10-16 ENCOUNTER — Other Ambulatory Visit (HOSPITAL_COMMUNITY): Payer: Self-pay

## 2022-10-16 ENCOUNTER — Other Ambulatory Visit: Payer: Self-pay

## 2022-10-16 ENCOUNTER — Other Ambulatory Visit: Payer: Self-pay | Admitting: Family Medicine

## 2022-10-16 MED ORDER — AMLODIPINE BESYLATE 2.5 MG PO TABS
2.5000 mg | ORAL_TABLET | Freq: Every day | ORAL | 0 refills | Status: DC
  Filled 2022-10-16 (×2): qty 90, 90d supply, fill #0

## 2022-10-23 ENCOUNTER — Other Ambulatory Visit (HOSPITAL_COMMUNITY): Payer: Self-pay

## 2022-10-26 ENCOUNTER — Encounter (INDEPENDENT_AMBULATORY_CARE_PROVIDER_SITE_OTHER): Payer: Self-pay | Admitting: Gastroenterology

## 2022-10-26 ENCOUNTER — Telehealth (INDEPENDENT_AMBULATORY_CARE_PROVIDER_SITE_OTHER): Payer: Self-pay | Admitting: Gastroenterology

## 2022-10-26 ENCOUNTER — Ambulatory Visit (INDEPENDENT_AMBULATORY_CARE_PROVIDER_SITE_OTHER): Payer: Commercial Managed Care - PPO | Admitting: Gastroenterology

## 2022-10-26 VITALS — BP 147/75 | HR 88 | Temp 98.0°F | Ht 64.0 in | Wt 169.8 lb

## 2022-10-26 DIAGNOSIS — K51011 Ulcerative (chronic) pancolitis with rectal bleeding: Secondary | ICD-10-CM

## 2022-10-26 DIAGNOSIS — A09 Infectious gastroenteritis and colitis, unspecified: Secondary | ICD-10-CM | POA: Diagnosis not present

## 2022-10-26 NOTE — Progress Notes (Signed)
Barbara Martin, M.D. Gastroenterology & Hepatology [facility] [address] [place], [place] [address]  Primary Care Physician: Alvira Monday, [address] [place] [id number]  I will communicate my assessment and recommendations to the referring MD via EMR.  Problems: Ulcerative proctitis  History of Present Illness: Barbara Martin is a 62 y.o. female with PMH ulcerative proctitis, HLD, asthma, HLD, who presents for follow up of UC.  The patient was last seen on 05/20/21 [facility], previously seeing Dr. Laural Golden). At that time, the patient was continue on Apriso 1.5 g (split in 4 doses during the day).  Most recent labs from 09/15/2022 showed normal CMP, CBC and vitamin D of 40.3.  TSH was 1.5.  Patient reports that for the last month she has presented recurrent episodes of rectal bleeding, 2-3 times per day. Stools are watery and fresh blood with every bowel movement. She reports that she also presents lower abdominal pain when she moves her bowels.   She reported she has been taking Apriso chronically for multiple years and was doing relatively well in terms of her symptoms.  The patient denies having any nausea, vomiting, fever, chills, melena, hematemesis, abdominal distention, jaundice, pruritus or weight loss.  Last flu shot:2023 Last pneumonia shot:never Last Pap smear: month ago, normal per patient Last evaluation by dermatology: 1 year ago Last zoster vaccine: possibly 5 years ago Last DEXA scan: never, had steroids for short period in the past COVID-19 shot: [place], 2 boosters  Last Colonoscopy:(09/04/15)Normal mucosa of terminal ileum. Acute colitis involving the rectal mucosa and distal half of sigmoid colon with transition zone at 37 cm from the anal margin. Endoscopic appearance typical of ulcerative colitis. Biopsies taken from mucosa of sigmoid colon and rectum  Past Medical History: Past Medical History:   Diagnosis Date   Asthma    Hyperlipidemia    Rectal bleeding    Ulcerative colitis [facility])     Past Surgical History: Past Surgical History:  Procedure Laterality Date   COLONOSCOPY N/A 09/04/2015   Procedure: COLONOSCOPY;  Surgeon: Rogene Houston, MD;  [facility];  Service: Endoscopy;  Laterality: N/A;  1:15 - moved to 12:15 - Ann to notify   [place]     rt wrist surgery x 2 for a varicose vein    Family History: Family History  Problem Relation Age of Onset   Hyperlipidemia Mother    Hypertension Mother    Dementia Mother    Arthritis Father    Cancer Father        leukemia   Hyperlipidemia Father    Asthma Brother    Heart disease Brother    Diabetes Brother        Type 1 diabetes   Heart disease Brother 30       valve issue    Social History: Social History   Tobacco Use  Smoking Status Never  Smokeless Tobacco Never   Social History   Substance and Sexual Activity  Alcohol Use No   Social History   Substance and Sexual Activity  Drug Use No    Allergies: No Known Allergies  Medications: Current Outpatient Medications  Medication Sig Dispense Refill   amLODipine (NORVASC) 2.5 MG tablet Take 1 tablet (2.5 mg total) by mouth daily. 90 tablet 0   Cholecalciferol (VITAMIN D3) 20 MCG (800 UNIT) TABS Take by mouth daily at 6 (six) AM.  fluticasone (FLONASE) 50 MCG/ACT nasal spray Place 2 sprays into both nostrils daily.     mesalamine (APRISO) 0.375 g 24 hr capsule Take 1 capsule (0.375 g total) by mouth in the morning, at noon, in the evening, and at bedtime. 360 capsule 3   rosuvastatin (CRESTOR) 10 MG tablet Take 1 tablet (10 mg total) by mouth daily. 90 tablet 3   No current facility-administered medications for this visit.    Review of Systems: GENERAL: negative for malaise, night sweats HEENT: No changes in hearing or vision, no nose bleeds or other nasal problems. NECK: Negative for lumps,  goiter, pain and significant neck swelling RESPIRATORY: Negative for cough, wheezing CARDIOVASCULAR: Negative for chest pain, leg swelling, palpitations, orthopnea GI: SEE HPI MUSCULOSKELETAL: Negative for joint pain or swelling, back pain, and muscle pain. SKIN: Negative for lesions, rash PSYCH: Negative for sleep disturbance, mood disorder and recent psychosocial stressors. HEMATOLOGY Negative for prolonged bleeding, bruising easily, and swollen nodes. ENDOCRINE: Negative for cold or heat intolerance, polyuria, polydipsia and goiter. NEURO: negative for tremor, gait imbalance, syncope and seizures. The remainder of the review of systems is noncontributory.   Physical Exam: BP (!) 147/75 (BP Location: Right Arm, Patient Position: Sitting, Cuff Size: Large)   Pulse 88   Temp 98 F (36.7 C) (Temporal)   Ht '5\' 4"'$  (1.626 m)   Wt 169 lb 12.8 oz (77 kg)   LMP 02/01/2011   BMI 29.15 kg/m  GENERAL: The patient is AO x3, in no acute distress. HEENT: Head is normocephalic and atraumatic. EOMI are intact. Mouth is well hydrated and without lesions. NECK: Supple. No masses LUNGS: Clear to auscultation. No presence of rhonchi/wheezing/rales. Adequate chest expansion HEART: RRR, normal s1 and s2. ABDOMEN: Soft, nontender, no guarding, no peritoneal signs, and nondistended. BS +. No masses. EXTREMITIES: Without any cyanosis, clubbing, rash, lesions or edema. NEUROLOGIC: AOx3, no focal motor deficit. SKIN: no jaundice, no rashes  Imaging/Labs: as above  I personally reviewed and interpreted the available labs, imaging and endoscopic files.  Impression and Plan: [name] is a 62 y.o. female with PMH ulcerative proctitis, HLD, asthma, HLD, who presents for follow up of UC.  The patient was doing relatively well in terms of symptom control but recently she presented flare of her symptoms despite taking the medication compliantly.  It is unclear if this has presented in the setting of an  infection triggering her flare.  We will check for C. difficile and GI pathogen panel today.  For now, we will continue her on Apriso 1.5 g every day.  Given the fact that endoscopic remission was never evaluated after treatment was started and the fact that she is now presenting with exacerbation of her symptoms, we will proceed as well with colonoscopy.  The patient was found to have elevated blood pressure when vital signs were checked in the office. The blood pressure was rechecked by the nursing staff and it was found be persistently elevated >140/90 mmHg. I personally advised to the patient to follow up closely with PCP for hypertension control.  - Check GI path and c.diff testing in stool - Schedule colonoscopy - Continue Apriso 1.5 qday  All questions were answered.      Barbara Peppers, MD Gastroenterology and Hepatology [facility]

## 2022-10-26 NOTE — Patient Instructions (Addendum)
Perform stool workup Schedule colonoscopy Continue Apriso 1.5 qday The patient was found to have elevated blood pressure when vital signs were checked in the office. The blood pressure was rechecked by the nursing staff and it was found be persistently elevated >140/90 mmHg. I personally advised to the patient to follow up closely with PCP for hypertension control.

## 2022-10-26 NOTE — H&P (View-Only) (Signed)
Barbara Martin, M.D. Gastroenterology & Hepatology [facility] [address] [place], [place] [address]  Primary Care Physician: Zarwolo, Gloria, FNP [address]  [place] [address]  I will communicate my assessment and recommendations to the referring MD via EMR.  Problems: Ulcerative proctitis  History of Present Illness: Barbara Martin is a 62 y.o. female with PMH ulcerative proctitis, HLD, asthma, HLD, who presents for follow up of UC.  The patient was last seen on 05/20/21 ([name], previously seeing Dr. Rehman). At that time, the patient was continue on Apriso 1.5 g (split in 4 doses during the day).  Most recent labs from 09/15/2022 showed normal CMP, CBC and vitamin D of 40.3.  TSH was 1.5.  Patient reports that for the last month she has presented recurrent episodes of rectal bleeding, 2-3 times per day. Stools are watery and fresh blood with every bowel movement. She reports that she also presents lower abdominal pain when she moves her bowels.   She reported she has been taking Apriso chronically for multiple years and was doing relatively well in terms of her symptoms.  The patient denies having any nausea, vomiting, fever, chills, melena, hematemesis, abdominal distention, jaundice, pruritus or weight loss.  Last flu shot:2023 Last pneumonia shot:never Last Pap smear: month ago, normal per patient Last evaluation by dermatology: 1 year ago Last zoster vaccine: possibly 5 years ago Last DEXA scan: never, had steroids for short period in the past COVID-19 shot: [employer], 2 boosters  Last Colonoscopy:(09/04/15)Normal mucosa of terminal ileum. Acute colitis involving the rectal mucosa and distal half of sigmoid colon with transition zone at 37 cm from the anal margin. Endoscopic appearance typical of ulcerative colitis. Biopsies taken from mucosa of sigmoid colon and rectum  Past Medical History: Past Medical History:   Diagnosis Date   Asthma    Hyperlipidemia    Rectal bleeding    Ulcerative colitis (HCC)     Past Surgical History: Past Surgical History:  Procedure Laterality Date   COLONOSCOPY N/A 09/04/2015   Procedure: COLONOSCOPY;  Surgeon: Najeeb U Rehman, MD;  [facility];  Service: Endoscopy;  Laterality: N/A;  1:15 - moved to 12:15 - Ann to notify   TUBAL LIGATION     1992   WRIST SURGERY     rt wrist surgery x 2 for a varicose vein    Family History: Family History  Problem Relation Age of Onset   Hyperlipidemia Mother    Hypertension Mother    Dementia Mother    Arthritis Father    Cancer Father        leukemia   Hyperlipidemia Father    Asthma Brother    Heart disease Brother    Diabetes Brother        Type 1 diabetes   Heart disease Brother 49       valve issue    Social History: Social History   Tobacco Use  Smoking Status Never  Smokeless Tobacco Never   Social History   Substance and Sexual Activity  Alcohol Use No   Social History   Substance and Sexual Activity  Drug Use No    Allergies: No Known Allergies  Medications: Current Outpatient Medications  Medication Sig Dispense Refill   amLODipine (NORVASC) 2.5 MG tablet Take 1 tablet (2.5 mg total) by mouth daily. 90 tablet 0   Cholecalciferol (VITAMIN D3) 20 MCG (800 UNIT) TABS Take by mouth daily at 6 (six) AM.       fluticasone (FLONASE) 50 MCG/ACT nasal spray Place 2 sprays into both nostrils daily.     mesalamine (APRISO) 0.375 g 24 hr capsule Take 1 capsule (0.375 g total) by mouth in the morning, at noon, in the evening, and at bedtime. 360 capsule 3   rosuvastatin (CRESTOR) 10 MG tablet Take 1 tablet (10 mg total) by mouth daily. 90 tablet 3   No current facility-administered medications for this visit.    Review of Systems: GENERAL: negative for malaise, night sweats HEENT: No changes in hearing or vision, no nose bleeds or other nasal problems. NECK: Negative for lumps,  goiter, pain and significant neck swelling RESPIRATORY: Negative for cough, wheezing CARDIOVASCULAR: Negative for chest pain, leg swelling, palpitations, orthopnea GI: SEE HPI MUSCULOSKELETAL: Negative for joint pain or swelling, back pain, and muscle pain. SKIN: Negative for lesions, rash PSYCH: Negative for sleep disturbance, mood disorder and recent psychosocial stressors. HEMATOLOGY Negative for prolonged bleeding, bruising easily, and swollen nodes. ENDOCRINE: Negative for cold or heat intolerance, polyuria, polydipsia and goiter. NEURO: negative for tremor, gait imbalance, syncope and seizures. The remainder of the review of systems is noncontributory.   Physical Exam: BP (!) 147/75 (BP Location: Right Arm, Patient Position: Sitting, Cuff Size: Large)   Pulse 88   Temp 98 F (36.7 C) (Temporal)   Ht 5' 4" (1.626 m)   Wt 169 lb 12.8 oz (77 kg)   LMP 02/01/2011   BMI 29.15 kg/m  GENERAL: The patient is AO x3, in no acute distress. HEENT: Head is normocephalic and atraumatic. EOMI are intact. Mouth is well hydrated and without lesions. NECK: Supple. No masses LUNGS: Clear to auscultation. No presence of rhonchi/wheezing/rales. Adequate chest expansion HEART: RRR, normal s1 and s2. ABDOMEN: Soft, nontender, no guarding, no peritoneal signs, and nondistended. BS +. No masses. EXTREMITIES: Without any cyanosis, clubbing, rash, lesions or edema. NEUROLOGIC: AOx3, no focal motor deficit. SKIN: no jaundice, no rashes  Imaging/Labs: as above  I personally reviewed and interpreted the available labs, imaging and endoscopic files.  Impression and Plan: Barbara Martin is a 62 y.o. female with PMH ulcerative proctitis, HLD, asthma, HLD, who presents for follow up of UC.  The patient was doing relatively well in terms of symptom control but recently she presented flare of her symptoms despite taking the medication compliantly.  It is unclear if this has presented in the setting of an  infection triggering her flare.  We will check for C. difficile and GI pathogen panel today.  For now, we will continue her on Apriso 1.5 g every day.  Given the fact that endoscopic remission was never evaluated after treatment was started and the fact that she is now presenting with exacerbation of her symptoms, we will proceed as well with colonoscopy.  The patient was found to have elevated blood pressure when vital signs were checked in the office. The blood pressure was rechecked by the nursing staff and it was found be persistently elevated >140/90 mmHg. I personally advised to the patient to follow up closely with PCP for hypertension control.  - Check GI path and c.diff testing in stool - Schedule colonoscopy - Continue Apriso 1.5 qday  All questions were answered.      Arissa Fagin Castaneda, MD Gastroenterology and Hepatology [facility]  

## 2022-10-26 NOTE — Telephone Encounter (Signed)
Attempted to contact pt to set up colonoscopy. Left message to return call

## 2022-10-29 ENCOUNTER — Other Ambulatory Visit (HOSPITAL_COMMUNITY): Payer: Self-pay

## 2022-10-29 ENCOUNTER — Encounter (HOSPITAL_COMMUNITY): Payer: Self-pay

## 2022-10-29 ENCOUNTER — Ambulatory Visit (HOSPITAL_COMMUNITY)
Admission: RE | Admit: 2022-10-29 | Discharge: 2022-10-29 | Disposition: A | Discharge status: Home / Self Care | Payer: Commercial Managed Care - PPO | Source: Ambulatory Visit | Attending: Family Medicine | Admitting: Family Medicine

## 2022-10-29 DIAGNOSIS — R928 Other abnormal and inconclusive findings on diagnostic imaging of breast: Secondary | ICD-10-CM | POA: Insufficient documentation

## 2022-10-29 DIAGNOSIS — A09 Infectious gastroenteritis and colitis, unspecified: Secondary | ICD-10-CM | POA: Diagnosis not present

## 2022-10-29 DIAGNOSIS — K51011 Ulcerative (chronic) pancolitis with rectal bleeding: Secondary | ICD-10-CM | POA: Diagnosis not present

## 2022-10-29 DIAGNOSIS — N6489 Other specified disorders of breast: Secondary | ICD-10-CM | POA: Diagnosis not present

## 2022-10-29 MED ORDER — PEG 3350-KCL-NA BICARB-NACL 420 G PO SOLR
4000.0000 mL | Freq: Once | ORAL | 0 refills | Status: AC
  Filled 2022-10-29 (×2): qty 4000, 1d supply, fill #0

## 2022-10-29 NOTE — Telephone Encounter (Signed)
Pt came into office this morning to schedule TCS. Pt on schedule for 11/25/22. Prep sent to pharmacy. Instructions printed out for patient. Gave pt pre service number for any questions regarding billing.

## 2022-10-29 NOTE — Addendum Note (Signed)
Addended by: Vicente Males on: 10/29/2022 08:13 AM   Modules accepted: Orders

## 2022-10-30 ENCOUNTER — Other Ambulatory Visit: Payer: Self-pay

## 2022-11-06 LAB — C. DIFFICILE GDH AND TOXIN A/B
GDH ANTIGEN: NOT DETECTED
MICRO NUMBER:: 14664211
SPECIMEN QUALITY:: ADEQUATE
TOXIN A AND B: NOT DETECTED

## 2022-11-06 LAB — GASTROINTESTINAL PATHOGEN PNL

## 2022-11-21 ENCOUNTER — Ambulatory Visit
Admission: EM | Admit: 2022-11-21 | Discharge: 2022-11-21 | Disposition: A | Discharge status: Home / Self Care | Payer: Commercial Managed Care - PPO | Attending: Nurse Practitioner | Admitting: Nurse Practitioner

## 2022-11-21 ENCOUNTER — Encounter: Payer: Self-pay | Admitting: Emergency Medicine

## 2022-11-21 DIAGNOSIS — J22 Unspecified acute lower respiratory infection: Secondary | ICD-10-CM | POA: Diagnosis not present

## 2022-11-21 MED ORDER — AMOXICILLIN-POT CLAVULANATE 875-125 MG PO TABS: 1.0000 | ORAL_TABLET | Freq: Two times a day (BID) | ORAL | 0 refills | Status: DC

## 2022-11-21 MED ORDER — BENZONATATE 100 MG PO CAPS: 100.0000 mg | ORAL_CAPSULE | Freq: Three times a day (TID) | ORAL | 0 refills | Status: DC

## 2022-11-21 NOTE — ED Triage Notes (Signed)
Productive Cough, runny nose, ears feel stopped up x 3 weeks.  Has taken benadryl and ibuprofen.  States chest hurts when coughing

## 2022-11-21 NOTE — Discharge Instructions (Addendum)
Take medication as prescribed. Increase fluids and allow for plenty of rest. Recommend Tylenol or ibuprofen as needed for pain, fever, or general discomfort.  Continue Flonase.  Recommend over-the-counter Coricidin HBP as needed for continued cough. As discussed, also recommend adding a daily allergy medication such as Allegra, Claritin, or Zyrtec. Warm salt water gargles 3-4 times daily to help with throat pain or discomfort. Recommend using a humidifier at bedtime during sleep to help with cough and nasal congestion. Sleep elevated on 2 pillows. Follow-up if your symptoms do not improve.

## 2022-11-21 NOTE — ED Provider Notes (Signed)
[facility]    CSN: [id number] Arrival date & time: 11/21/22  0800      History   Chief Complaint No chief complaint on file.   HPI Barbara Martin is a 62 y.o. female.   The history is provided by the patient.   The patient presents for her complaints of runny nose, bilateral ear fullness and pressure, runny nose, and headache that have been present for the past 3 weeks.  Patient denies fever, chills, ear pain, ear drainage, difficulty breathing, or GI symptoms.  Patient reports that she also has chest pain when she is coughing.  She states that she does have a history of seasonal allergies.  Patient reports history of childhood asthma, states that she has not had any problems with asthma for the past 6 or 7 years.  She denies history of smoking.  Reports she has been taking Benadryl, Flonase and Advil for her symptoms. Past Medical History:  Diagnosis Date   Asthma    Hyperlipidemia    Rectal bleeding    Ulcerative colitis [facility])     Patient Active Problem List   Diagnosis Date Noted   Acute infectious diarrhea 10/26/2022   Pap smear for cervical cancer screening 06/15/2022   Allergies 12/03/2021   Overweight (BMI 25.0-29.9) 07/24/2021   Annual physical exam 07/24/2021   Hypertension 07/24/2021   Skin lesion 07/24/2021   Encounter to establish care 01/13/2021   Hyperlipidemia 03/31/2019   Multiple nevi 03/29/2019   UC (ulcerative colitis) ([place]) 11/12/2015   Asthma 09/02/2015    Past Surgical History:  Procedure Laterality Date   COLONOSCOPY N/A 09/04/2015   Procedure: COLONOSCOPY;  Surgeon: Rogene Houston, MD;  Location: [facility];  Service: Endoscopy;  Laterality: N/A;  1:15 - moved to 12:15 - Ann to notify   [place]     rt wrist surgery x 2 for a varicose vein    OB History   No obstetric history on file.      Home Medications    Prior to Admission medications   Medication Sig Start Date End Date  Taking? Authorizing Provider  amLODipine (NORVASC) 2.5 MG tablet Take 1 tablet (2.5 mg total) by mouth daily. 10/16/22   Alvira Monday, FNP  fluticasone (FLONASE) 50 MCG/ACT nasal spray Place 2 sprays into both nostrils daily.    [provider]  mesalamine (APRISO) 0.375 g 24 hr capsule Take 1 capsule (0.375 g total) by mouth in the morning, at noon, in the evening, and at bedtime. 08/03/22   Carlan, [name] L, NP  rosuvastatin (CRESTOR) 10 MG tablet Take 1 tablet (10 mg total) by mouth daily. 06/15/22   Alvira Monday, FNP  Vitamin D, Cholecalciferol, 10 MCG (400 UNIT) CAPS Take 400 Units by mouth daily.    [provider]    Family History Family History  Problem Relation Age of Onset   Hyperlipidemia Mother    Hypertension Mother    Dementia Mother    Arthritis Father    Cancer Father        leukemia   Hyperlipidemia Father    Asthma Brother    Heart disease Brother    Diabetes Brother        Type 1 diabetes   Heart disease Brother 19       valve issue    Social History Social History   Tobacco Use   Smoking status: Never   Smokeless  tobacco: Never  Vaping Use   Vaping Use: Never used  Substance Use Topics   Alcohol use: No   Drug use: No     Allergies   Patient has no known allergies.   Review of Systems Review of Systems Per HPI  Physical Exam Triage Vital Signs ED Triage Vitals  Enc Vitals Group     BP 11/21/22 0806 (!) 149/74     Pulse Rate 11/21/22 0806 100     Resp 11/21/22 0806 18     Temp 11/21/22 0806 98.7 F (37.1 C)     Temp Source 11/21/22 0806 Oral     SpO2 11/21/22 0806 98 %     Weight --      Height --      Head Circumference --      Peak Flow --      Pain Score 11/21/22 0807 0     Pain Loc --      Pain Edu? --      Excl. in [place]? --    No data found.  Updated Vital Signs BP (!) 149/74 (BP Location: Right Arm)   Pulse 100   Temp 98.7 F (37.1 C) (Oral)   Resp 18   LMP 02/01/2011   SpO2 98%   Visual  Acuity Right Eye Distance:   Left Eye Distance:   Bilateral Distance:    Right Eye Near:   Left Eye Near:    Bilateral Near:     Physical Exam Vitals and nursing note reviewed.  Constitutional:      General: She is not in acute distress.    Appearance: Normal appearance. She is well-developed.  HENT:     Head: Normocephalic.     Right Ear: Tympanic membrane, ear canal and external ear normal.     Left Ear: Tympanic membrane, ear canal and external ear normal.     Nose: Congestion present. No rhinorrhea.     Right Turbinates: Enlarged and swollen.     Left Turbinates: Enlarged and swollen.     Right Sinus: No maxillary sinus tenderness or frontal sinus tenderness.     Left Sinus: No maxillary sinus tenderness or frontal sinus tenderness.     Mouth/Throat:     Lips: Pink.     Mouth: Mucous membranes are moist.     Pharynx: Uvula midline. Posterior oropharyngeal erythema present. No pharyngeal swelling or oropharyngeal exudate.     Comments: Cobblestoning present on posterior oropharynx Eyes:     General:        Right eye: No discharge.     Extraocular Movements: Extraocular movements intact.     Pupils: Pupils are equal, round, and reactive to light.  Cardiovascular:     Rate and Rhythm: Normal rate and regular rhythm.     Heart sounds: Normal heart sounds.  Pulmonary:     Effort: Pulmonary effort is normal.     Breath sounds: Normal breath sounds.  Abdominal:     General: Bowel sounds are normal. There is no distension.     Palpations: Abdomen is soft.     Tenderness: There is no abdominal tenderness. There is no guarding or rebound.  Genitourinary:    Vagina: Normal. No vaginal discharge.  Musculoskeletal:     Cervical back: Normal range of motion.  Skin:    General: Skin is warm and dry.     Findings: No erythema or rash.  Neurological:     General: No focal deficit present.  Mental Status: She is alert and oriented to person, place, and time.     Cranial  Nerves: No cranial nerve deficit.  Psychiatric:        Mood and Affect: Mood normal.        Behavior: Behavior normal.      UC Treatments / Results  Labs (all labs ordered are listed, but only abnormal results are displayed) Labs Reviewed - No data to display  EKG   Radiology No results found.  Procedures Procedures (including critical care time)  Medications Ordered in UC Medications - No data to display  Initial Impression / Assessment and Plan / UC Course  I have reviewed the triage vital signs and the nursing notes.  Pertinent labs & imaging results that were available during my care of the patient were reviewed by me and considered in my medical decision making (see chart for details).  Patient is well-appearing, she is in no acute distress, she is mildly hypertensive, but vital signs are otherwise stable.  Suspect lower respiratory infection given the duration of the patient's symptoms.  Viral testing is not indicated at this time based on the duration of the patient's symptoms.  No indication for imaging as patient's O2 sat is at 98% on room air, lung sounds are clear throughout.  Will treat patient with Augmentin 875/125 mg to cover for lower respiratory infection versus sinusitis.  For patient's cough, Tessalon pearls 100 mg was also prescribed.  Patient advised she can take over-the-counter cough and cold medications such as Coricidin HBP that will not affect her blood pressure.  Supportive care recommendations were provided to the patient to include use of a humidifier, over-the-counter analgesics, and increasing her fluids and getting plenty of rest.  Patient is in agreement with this plan of care and verbalizes understanding.  All questions were answered.  Patient stable for discharge. Final Clinical Impressions(s) / UC Diagnoses   Final diagnoses:  None   Discharge Instructions   None    ED Prescriptions   None    PDMP not reviewed this encounter.    Tish Men, NP 11/21/22 [phone]

## 2022-11-23 ENCOUNTER — Telehealth (INDEPENDENT_AMBULATORY_CARE_PROVIDER_SITE_OTHER): Payer: Self-pay | Admitting: Gastroenterology

## 2022-11-23 NOTE — Telephone Encounter (Signed)
Should be ok to proceed, unless she is not feeling better Thanks

## 2022-11-23 NOTE — Telephone Encounter (Signed)
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

## 2022-11-23 NOTE — Telephone Encounter (Signed)
Spoke to patient about continuing with colonoscopy on 4/3

## 2022-11-23 NOTE — Telephone Encounter (Signed)
Ok to continue with TCS? Please advise. Thank you

## 2022-11-23 NOTE — Telephone Encounter (Signed)
Pt contacted and informed she would be ok to proceed if feeling ok. Pt verbalized understanding.

## 2022-11-25 ENCOUNTER — Ambulatory Visit (HOSPITAL_COMMUNITY): Payer: Commercial Managed Care - PPO | Admitting: Certified Registered Nurse Anesthetist

## 2022-11-25 ENCOUNTER — Other Ambulatory Visit (HOSPITAL_COMMUNITY): Payer: Self-pay

## 2022-11-25 ENCOUNTER — Ambulatory Visit (HOSPITAL_BASED_OUTPATIENT_CLINIC_OR_DEPARTMENT_OTHER): Payer: Commercial Managed Care - PPO | Admitting: Certified Registered Nurse Anesthetist

## 2022-11-25 ENCOUNTER — Encounter (HOSPITAL_COMMUNITY)
Admission: RE | Disposition: A | Discharge status: Home / Self Care | Payer: Self-pay | Source: Ambulatory Visit | Attending: Gastroenterology

## 2022-11-25 ENCOUNTER — Encounter (HOSPITAL_COMMUNITY): Payer: Self-pay | Admitting: Gastroenterology

## 2022-11-25 ENCOUNTER — Encounter (INDEPENDENT_AMBULATORY_CARE_PROVIDER_SITE_OTHER): Payer: Self-pay | Admitting: *Deleted

## 2022-11-25 ENCOUNTER — Ambulatory Visit (HOSPITAL_COMMUNITY)
Admission: RE | Admit: 2022-11-25 | Discharge: 2022-11-25 | Disposition: A | Discharge status: Home / Self Care | Payer: Commercial Managed Care - PPO | Source: Ambulatory Visit | Attending: Gastroenterology | Admitting: Gastroenterology

## 2022-11-25 ENCOUNTER — Other Ambulatory Visit: Payer: Self-pay

## 2022-11-25 DIAGNOSIS — K5150 Left sided colitis without complications: Secondary | ICD-10-CM | POA: Diagnosis not present

## 2022-11-25 DIAGNOSIS — D122 Benign neoplasm of ascending colon: Secondary | ICD-10-CM

## 2022-11-25 DIAGNOSIS — K51511 Left sided colitis with rectal bleeding: Secondary | ICD-10-CM | POA: Diagnosis not present

## 2022-11-25 DIAGNOSIS — K635 Polyp of colon: Secondary | ICD-10-CM

## 2022-11-25 DIAGNOSIS — K625 Hemorrhage of anus and rectum: Secondary | ICD-10-CM

## 2022-11-25 DIAGNOSIS — K644 Residual hemorrhoidal skin tags: Secondary | ICD-10-CM

## 2022-11-25 DIAGNOSIS — I10 Essential (primary) hypertension: Secondary | ICD-10-CM | POA: Insufficient documentation

## 2022-11-25 DIAGNOSIS — E785 Hyperlipidemia, unspecified: Secondary | ICD-10-CM | POA: Diagnosis not present

## 2022-11-25 DIAGNOSIS — K5120 Ulcerative (chronic) proctitis without complications: Secondary | ICD-10-CM

## 2022-11-25 DIAGNOSIS — J45909 Unspecified asthma, uncomplicated: Secondary | ICD-10-CM | POA: Diagnosis not present

## 2022-11-25 HISTORY — PX: POLYPECTOMY: SHX5525

## 2022-11-25 HISTORY — PX: COLONOSCOPY WITH PROPOFOL: SHX5780

## 2022-11-25 HISTORY — PX: BIOPSY: SHX5522

## 2022-11-25 LAB — HM COLONOSCOPY

## 2022-11-25 SURGERY — COLONOSCOPY WITH PROPOFOL
Anesthesia: General

## 2022-11-25 MED ORDER — MESALAMINE ER 0.375 G PO CP24
1.1250 g | ORAL_CAPSULE | Freq: Four times a day (QID) | ORAL | 0 refills | Status: DC
Start: 2022-11-25 — End: 2023-06-14
  Filled 2022-11-25 (×2): qty 1080, 90d supply, fill #0

## 2022-11-25 MED ORDER — LACTATED RINGERS IV SOLN
INTRAVENOUS | Status: DC
  Administered 2022-11-25: 1000 mL via INTRAVENOUS

## 2022-11-25 MED ORDER — PROPOFOL 500 MG/50ML IV EMUL
INTRAVENOUS | Status: DC | PRN
  Administered 2022-11-25: 150 ug/kg/min via INTRAVENOUS

## 2022-11-25 MED ORDER — PROPOFOL 500 MG/50ML IV EMUL
INTRAVENOUS | Status: AC
  Filled 2022-11-25: qty 50

## 2022-11-25 MED ORDER — MESALAMINE 1000 MG RE SUPP
1000.0000 mg | Freq: Every day | RECTAL | 1 refills | Status: DC
  Filled 2022-11-25 (×2): qty 30, 30d supply, fill #0
  Filled 2023-10-19: qty 30, 30d supply, fill #1

## 2022-11-25 MED ORDER — PROPOFOL 10 MG/ML IV BOLUS
INTRAVENOUS | Status: DC | PRN
  Administered 2022-11-25: 60 mg via INTRAVENOUS
  Administered 2022-11-25: 30 mg via INTRAVENOUS

## 2022-11-25 NOTE — Transfer of Care (Signed)
Immediate Anesthesia Transfer of Care Note  Patient: Barbara Martin  Procedure(s) Performed: COLONOSCOPY WITH PROPOFOL POLYPECTOMY BIOPSY  Patient Location: Endoscopy Unit  Anesthesia Type:General  Level of Consciousness: drowsy  Airway & Oxygen Therapy: Patient Spontanous Breathing  Post-op Assessment: Report given to RN and Post -op Vital signs reviewed and stable  Post vital signs: Reviewed and stable  Last Vitals:  Vitals Value Taken Time  BP    Temp    Pulse    Resp    SpO2      Last Pain:  Vitals:   11/25/22 1332  TempSrc:   PainSc: 0-No pain      Patients Stated Pain Goal: 6 ([id number])  Complications: No notable events documented.

## 2022-11-25 NOTE — Op Note (Signed)
[facility] Patient Name: Barbara Martin Procedure Date: 11/25/2022 1:29 PM MRN: [id number] Date of Birth: 1960/12/27 Attending MD: Maylon Peppers , , [id number] CSN: [id number] Age: 62 Admit Type: Outpatient Procedure:                Colonoscopy Indications:              Rectal bleeding, Left-sided chronic ulcerative                            colitis Providers:                Maylon Peppers, Caprice Kluver, Tammy Vaught, RN,                            Kristine L. Risa Grill, Technician Referring MD:              Medicines:                Monitored Anesthesia Care Complications:            No immediate complications. Estimated Blood Loss:     Estimated blood loss: none. Procedure:                Pre-Anesthesia Assessment:                           - Prior to the procedure, a History and Physical                            was performed, and patient medications, allergies                            and sensitivities were reviewed. The patient's                            tolerance of previous anesthesia was reviewed.                           - The risks and benefits of the procedure and the                            sedation options and risks were discussed with the                            patient. All questions were answered and informed                            consent was obtained.                           - ASA Grade Assessment: II - A patient with mild                            systemic disease.                           After obtaining informed consent, the colonoscope  was passed under direct vision. Throughout the                            procedure, the patient's blood pressure, pulse, and                            oxygen saturations were monitored continuously. The                            [id number]) scope was introduced through                            the anus and advanced to the the terminal ileum.                             The colonoscopy was performed without difficulty.                            The patient tolerated the procedure well. The                            quality of the bowel preparation was good. Scope In: 1:36:34 PM Scope Out: 2:00:33 PM Scope Withdrawal Time: 0 hours 13 minutes 12 seconds  Total Procedure Duration: 0 hours 23 minutes 59 seconds  Findings:      Skin tags were found on perianal exam.      A 5 mm polyp was found in the ascending colon. The polyp was       semi-sessile. The polyp was removed with a cold snare. Resection and       retrieval were complete.      Inflammation was found in a continuous and circumferential pattern from       the rectum to the descending colon (30 cm from anal verge). This was       graded as Mayo Score 2 (moderate, with marked erythema, absent vascular       pattern, friability, erosions), and when compared to the previous       examination, the findings are unchanged. Biopsies were taken with a cold       forceps for histology and CMV testing.      No retroflexion performed due to inflammation. Impression:               - Perianal skin tags found on perianal exam.                           - One 5 mm polyp in the ascending colon, removed                            with a cold snare. Resected and retrieved.                           - Moderately active (Mayo Score 2) ulcerative                            colitis up to 30 cm, unchanged since the last  examination. Biopsied. Moderate Sedation:      Per Anesthesia Care Recommendation:           - Discharge patient to home (ambulatory).                           - Resume previous diet.                           - Await pathology results.                           - Repeat colonoscopy for surveillance based on                            pathology results.                           - Increase Apriso to 1.5 g every 8 hours (4                            capsules)  every 8 hours.                           - Mesalamine suppositories every night for one                            month, then every other day. Procedure Code(s):        --- Professional ---                           [phone], Colonoscopy, flexible; with removal of                            tumor(s), polyp(s), or other lesion(s) by snare                            technique                           45380, 81, Colonoscopy, flexible; with biopsy,                            single or multiple Diagnosis Code(s):        --- Professional ---                           D12.2, Benign neoplasm of ascending colon                           K51.511, Left sided colitis with rectal bleeding                           K64.4, Residual hemorrhoidal skin tags                           K62.5, Hemorrhage of anus and rectum CPT copyright 2022 American Medical Association. All rights  reserved. The codes documented in this report are preliminary and upon coder review may  be revised to meet current compliance requirements. Maylon Peppers, MD Maylon Peppers,  11/25/2022 2:11:08 PM This report has been signed electronically. Number of Addenda: 0

## 2022-11-25 NOTE — Interval H&P Note (Signed)
History and Physical Interval Note:  11/25/2022 12:22 PM  Barbara Martin  has presented today for surgery, with the diagnosis of UC.  The various methods of treatment have been discussed with the patient and family. After consideration of risks, benefits and other options for treatment, the patient has consented to  Procedure(s) with comments: COLONOSCOPY WITH PROPOFOL (N/A) - 1:00pm, asa 1-2 as a surgical intervention.  The patient's history has been reviewed, patient examined, no change in status, stable for surgery.  I have reviewed the patient's chart and labs.  Questions were answered to the patient's satisfaction.     Maylon Peppers [place]

## 2022-11-25 NOTE — Anesthesia Preprocedure Evaluation (Signed)
Anesthesia Evaluation  Patient identified by MRN, date of birth, ID band Patient awake    Reviewed: Allergy & Precautions, H&P , NPO status , Patient's Chart, lab work & pertinent test results, reviewed documented beta blocker date and time   Airway Mallampati: II  TM Distance: >3 FB Neck ROM: full    Dental no notable dental hx.    Pulmonary neg pulmonary ROS, asthma    Pulmonary exam normal breath sounds clear to auscultation       Cardiovascular Exercise Tolerance: Good hypertension, negative cardio ROS  Rhythm:regular Rate:Normal     Neuro/Psych negative neurological ROS  negative psych ROS   GI/Hepatic negative GI ROS, Neg liver ROS, PUD,,,  Endo/Other  negative endocrine ROS    Renal/GU negative Renal ROS  negative genitourinary   Musculoskeletal   Abdominal   Peds  Hematology negative hematology ROS (+)   Anesthesia Other Findings   Reproductive/Obstetrics negative OB ROS                             Anesthesia Physical Anesthesia Plan  ASA: 2  Anesthesia Plan: General   Post-op Pain Management:    Induction:   PONV Risk Score and Plan: Propofol infusion  Airway Management Planned:   Additional Equipment:   Intra-op Plan:   Post-operative Plan:   Informed Consent: I have reviewed the patients History and Physical, chart, labs and discussed the procedure including the risks, benefits and alternatives for the proposed anesthesia with the patient or authorized representative who has indicated his/her understanding and acceptance.     Dental Advisory Given  Plan Discussed with: CRNA  Anesthesia Plan Comments:        Anesthesia Quick Evaluation

## 2022-11-25 NOTE — Discharge Instructions (Signed)
You are being discharged to home.  Resume your previous diet.  We are waiting for your pathology results.  Your physician has recommended a repeat colonoscopy for surveillance based on pathology results.  Increase Apriso to 1.5 g every 8 hours (4 capsules) every 8 hours. Mesalamine suppositories every night for one month, then every other day.

## 2022-11-27 NOTE — Anesthesia Postprocedure Evaluation (Signed)
Anesthesia Post Note  Patient: Barbara Martin  Procedure(s) Performed: COLONOSCOPY WITH PROPOFOL POLYPECTOMY BIOPSY  Patient location during evaluation: Phase II Anesthesia Type: General Level of consciousness: awake Pain management: pain level controlled Vital Signs Assessment: post-procedure vital signs reviewed and stable Respiratory status: spontaneous breathing and respiratory function stable Cardiovascular status: blood pressure returned to baseline and stable Postop Assessment: no headache and no apparent nausea or vomiting Anesthetic complications: no Comments: Late entry   No notable events documented.   Last Vitals:  Vitals:   11/25/22 1234 11/25/22 1404  BP: (!) 153/65 (!) 108/44  Pulse: 95 87  Resp: 17 17  Temp: 36.6 C 36.4 C  SpO2: 100% 97%    Last Pain:  Vitals:   11/25/22 1404  TempSrc: Axillary  PainSc: 0-No pain                 Windell Norfolk

## 2022-12-01 ENCOUNTER — Encounter (HOSPITAL_COMMUNITY): Payer: Self-pay | Admitting: Gastroenterology

## 2022-12-09 LAB — SURGICAL PATHOLOGY

## 2022-12-14 ENCOUNTER — Other Ambulatory Visit (HOSPITAL_COMMUNITY): Payer: Self-pay

## 2022-12-14 ENCOUNTER — Other Ambulatory Visit: Payer: Self-pay

## 2022-12-18 ENCOUNTER — Other Ambulatory Visit (INDEPENDENT_AMBULATORY_CARE_PROVIDER_SITE_OTHER): Payer: Self-pay | Admitting: Gastroenterology

## 2022-12-18 ENCOUNTER — Telehealth (INDEPENDENT_AMBULATORY_CARE_PROVIDER_SITE_OTHER): Payer: Self-pay

## 2022-12-18 ENCOUNTER — Other Ambulatory Visit (HOSPITAL_COMMUNITY): Payer: Self-pay

## 2022-12-18 DIAGNOSIS — K51811 Other ulcerative colitis with rectal bleeding: Secondary | ICD-10-CM

## 2022-12-18 MED ORDER — PREDNISONE 10 MG PO TABS
ORAL_TABLET | ORAL | 0 refills | Status: AC
Start: 2022-12-18 — End: 2023-01-18
  Filled 2022-12-18: qty 70, 28d supply, fill #0

## 2022-12-18 NOTE — Telephone Encounter (Signed)
Patient called today states she has been having rectal bleeding and diarrhea as of late and does not thing the Mesalamine is working any longer. Patient says she is taking Mesalamine 0.375 twelve capsule per day and a Mesalamine 1000 Suppository at night. She has done this for several seven to eight years. She says that when this use to happen to her Dr. Karilyn Cota would send in prednisone. Please advise. [phone]

## 2022-12-18 NOTE — Telephone Encounter (Signed)
Thanks for the update, I will send in a short prednisone taper.  Please send an order for QuantiFERON and hepatitis B testing as we will need to discuss other advanced therapies for her ulcerative colitis.  Leeroy Bock, FYI she will see you in June.  I considered giving them moderate disease that she is presenting in the left side of the colon, the options that we could consider for her include Entyvio, Stelara, Velsipity or ozanimod.  If needed to choose between Velsipity and ozanimod, I will rather use the first option as they provide all the premedication testing for free.

## 2022-12-21 ENCOUNTER — Other Ambulatory Visit (HOSPITAL_COMMUNITY): Payer: Self-pay

## 2022-12-21 ENCOUNTER — Other Ambulatory Visit (INDEPENDENT_AMBULATORY_CARE_PROVIDER_SITE_OTHER): Payer: Self-pay

## 2022-12-21 DIAGNOSIS — Z111 Encounter for screening for respiratory tuberculosis: Secondary | ICD-10-CM

## 2022-12-21 DIAGNOSIS — Z1159 Encounter for screening for other viral diseases: Secondary | ICD-10-CM

## 2022-12-21 NOTE — Telephone Encounter (Signed)
I placed the orders in [facility] for [facility]. I tried calling patient no answer.

## 2022-12-22 NOTE — Telephone Encounter (Signed)
Patient made aware that we have placed lab orders for her to have labs drawn at [facility], and we will discuss further treatments for UC at next ov. Patient states understanding.

## 2022-12-22 NOTE — Telephone Encounter (Signed)
I called left a message asked that the patient please return call.

## 2022-12-28 DIAGNOSIS — Z1159 Encounter for screening for other viral diseases: Secondary | ICD-10-CM | POA: Diagnosis not present

## 2022-12-28 DIAGNOSIS — Z111 Encounter for screening for respiratory tuberculosis: Secondary | ICD-10-CM | POA: Diagnosis not present

## 2022-12-30 LAB — QUANTIFERON-TB GOLD PLUS
Mitogen-NIL: 1.35 IU/mL
NIL: 0.01 IU/mL
QuantiFERON-TB Gold Plus: NEGATIVE
TB1-NIL: 0 IU/mL
TB2-NIL: 0 IU/mL

## 2022-12-30 LAB — HEPATITIS B SURFACE ANTIGEN: Hepatitis B Surface Ag: NONREACTIVE

## 2023-01-01 ENCOUNTER — Ambulatory Visit: Payer: Commercial Managed Care - PPO | Admitting: Family Medicine

## 2023-01-18 ENCOUNTER — Other Ambulatory Visit: Payer: Self-pay | Admitting: Family Medicine

## 2023-01-19 ENCOUNTER — Other Ambulatory Visit (HOSPITAL_COMMUNITY): Payer: Self-pay

## 2023-01-19 MED ORDER — AMLODIPINE BESYLATE 2.5 MG PO TABS
2.5000 mg | ORAL_TABLET | Freq: Every day | ORAL | 0 refills | Status: DC
  Filled 2023-01-19: qty 90, 90d supply, fill #0

## 2023-01-25 ENCOUNTER — Other Ambulatory Visit (HOSPITAL_COMMUNITY): Payer: Self-pay

## 2023-02-04 ENCOUNTER — Ambulatory Visit (INDEPENDENT_AMBULATORY_CARE_PROVIDER_SITE_OTHER): Payer: Commercial Managed Care - PPO | Admitting: Gastroenterology

## 2023-02-04 ENCOUNTER — Encounter (INDEPENDENT_AMBULATORY_CARE_PROVIDER_SITE_OTHER): Payer: Self-pay | Admitting: Gastroenterology

## 2023-02-04 VITALS — BP 125/70 | HR 82 | Temp 97.9°F | Ht 64.0 in | Wt 164.9 lb

## 2023-02-04 DIAGNOSIS — K51811 Other ulcerative colitis with rectal bleeding: Secondary | ICD-10-CM | POA: Diagnosis not present

## 2023-02-04 NOTE — Progress Notes (Addendum)
Referring Provider: Gilmore Laroche, FNP Primary Care Physician:  Gilmore Laroche, FNP Primary GI Physician: Levon Hedger   Chief Complaint  Patient presents with   Ulcerative Colitis    Follow up on UC. Reports doing better since taking prednisone and not currently having any issues.    HPI:   Barbara Martin is a 62 y.o. female with past medical history of ulcerative colitis, HLD, asthma, HLD   Patient presenting today for follow up of UC.  Last seen march 2024, at that time having recurrent rectal bleeding, 2-3x/day, stools watery.  Scheduled for colonoscopy, as outlined below, advised to increase Apriso to 1.125g QID, mesalamine suppository nightly x 1 month then every other day, sent steroid taper in April but recommended to discuss other treatment options for her UC.  Present: Patient states she is doing well since steroid taper. No further rectal bleeding. No abdominal pain, having 1 Formed stool per day. Appetite is good. She denies any weight loss. No nausea or vomiting. She is not doing mesalamine suppository, she did them for a month and then stopped as she did not know she was supposed to continue this.   Extraintestinal Manifestations: Skin: none  Joints: none  Eyes: none  Last flu shot:2024 Last pneumonia shot:never Last Pap smear: January 2024 Last evaluation by dermatology: 2 years ago Last zoster vaccine: possibly 5 years ago Last DEXA scan: never, had steroids for short period in the past COVID-19 shot: Pfizer, 2 boosters  Last Colonoscopy:11/2022  - Perianal skin tags found on perianal exam.                           - One 5 mm polyp in the ascending colon, removed                            with a cold snare. Resected and retrieved.                           - Moderately active (Mayo Score 2) ulcerative                            colitis up to 30 cm, unchanged since the last                            examination. Biopsied-chronic active colitis    Recommendations:  Repeat colonoscopy 3 years   Past Medical History:  Diagnosis Date   Asthma    Hyperlipidemia    Rectal bleeding    Ulcerative colitis [facility])     Past Surgical History:  Procedure Laterality Date   BIOPSY  11/25/2022   Procedure: BIOPSY;  Surgeon: Dolores Frame, MD;  [facility];  Service: Gastroenterology;;   COLONOSCOPY N/A 09/04/2015   Procedure: COLONOSCOPY;  Surgeon: Malissa Hippo, MD;  [facility];  Service: Endoscopy;  Laterality: N/A;  1:15 - moved to 12:15 - Ann to notify   COLONOSCOPY WITH PROPOFOL N/A 11/25/2022   Procedure: COLONOSCOPY WITH PROPOFOL;  Surgeon: Dolores Frame, MD;  [facility];  Service: Gastroenterology;  Laterality: N/A;  1:00pm, asa 1-2   POLYPECTOMY  11/25/2022   Procedure: POLYPECTOMY;  Surgeon: Dolores Frame, MD;  [facility];  Service: Gastroenterology;;   TUBAL  LIGATION     1992   WRIST SURGERY     rt wrist surgery x 2 for a varicose vein    Current Outpatient Medications  Medication Sig Dispense Refill   amLODipine (NORVASC) 2.5 MG tablet Take 1 tablet (2.5 mg total) by mouth daily. 90 tablet 0   fluticasone (FLONASE) 50 MCG/ACT nasal spray Place 2 sprays into both nostrils daily.     mesalamine (APRISO) 0.375 g 24 hr capsule Take 3 capsules (1.125 g total) by mouth in the morning, at noon, in the evening, and at bedtime. 1080 capsule 0   mesalamine (CANASA) 1000 MG suppository Place 1 suppository (1,000 mg total) rectally at bedtime every night for a month, then every other night. 30 suppository 1   Multiple Vitamin (MULTIVITAMIN) tablet Take 1 tablet by mouth daily.     rosuvastatin (CRESTOR) 10 MG tablet Take 1 tablet (10 mg total) by mouth daily. 90 tablet 3   Vitamin D, Cholecalciferol, 10 MCG (400 UNIT) CAPS Take 400 Units by mouth daily.     No current facility-administered medications for this visit.    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  Tobacco Use   Smoking status: Never    Passive exposure: Never   Smokeless tobacco: Never  Vaping Use   Vaping Use: Never used  Substance and Sexual Activity   Alcohol use: No   Drug use: No   Sexual activity: Yes    Birth control/protection: Surgical  Other Topics Concern   Not on file  Social History Narrative   Not on file   Social Determinants of Health   Financial Resource Strain: Not on file  Food Insecurity: Not on file  Transportation Needs: Not on file  Physical Activity: Not on file  Stress: Not on file  Social Connections: Not on file    Review of systems General: negative for malaise, night sweats, fever, chills, weight loss Neck: Negative for lumps, goiter, pain and significant neck swelling Resp: Negative for cough, wheezing, dyspnea at rest CV: Negative for chest pain, leg swelling, palpitations, orthopnea GI: denies melena, hematochezia, nausea, vomiting, diarrhea, constipation, dysphagia, odyonophagia, early satiety or unintentional weight loss.  MSK: Negative for joint pain or swelling, back pain, and muscle pain. Derm: Negative for itching or rash Psych: Denies depression, anxiety, memory loss, confusion. No homicidal or suicidal ideation.  Heme: Negative for prolonged bleeding, bruising easily, and swollen nodes. Endocrine: Negative for  cold or heat intolerance, polyuria, polydipsia and goiter. Neuro: negative for tremor, gait imbalance, syncope and seizures. The remainder of the review of systems is noncontributory.  Physical Exam: BP 125/70 (BP Location: Left  Arm, Patient Position: Sitting, Cuff Size: Large)   Pulse 82   Temp 97.9 F (36.6 C) (Oral)   Ht 5\' 4"  (1.626 m)   Wt 164 lb 14.4 oz (74.8 kg)   LMP 02/01/2011   BMI 28.31 kg/m  General:   Alert and oriented. No distress noted. Pleasant and cooperative.  Head:  Normocephalic and atraumatic. Eyes:  Conjuctiva clear without scleral icterus. Mouth:  Oral mucosa pink and moist. Good dentition. No lesions. Heart: Normal rate and rhythm, s1 and s2 heart sounds present.  Lungs: Clear lung sounds in all lobes. Respirations equal and unlabored. Abdomen:  +BS, soft, non-tender and non-distended. No rebound or guarding. No HSM or masses noted. Derm: No palmar erythema or jaundice Msk:  Symmetrical without gross deformities. Normal posture. Extremities:  Without edema. Neurologic:  Alert and  oriented x4 Psych:  Alert and cooperative. Normal mood and affect.  Invalid input(s): "6 MONTHS"   ASSESSMENT: DEAN BUENAFE is a 62 y.o. female presenting today for follow up of ulcerative colitis  Previously seen in march with rectal bleeding and diarrhea, underwent colonoscopy with moderately active disease.  She required steroid taper in April for UC flare.  She is feeling good today and denies any further rectal bleeding, no abdominal pain, having 1 formed stool per day.  I did discuss with her the recommendation to potentially switch therapy as her disease does not appear well-controlled on Apriso given active inflammation on her colonoscopy. Discussion was held regarding benefits and side effects of immunomodulators/biologicals (including infections, malignancies such as lymphoma, skin cancer, tolerance to medication), as well as need to control her disease clinically and  endoscopically (ideally microscopically as well) to avoid recurrence of her symptomatology. Patient will require ongoing treatment for management of her IBD as this is a chronic disease. I have provided the names of the therapies that are felt may be the best options for her and recommended she research these and let me know if she wishes to proceed with starting one of these or if she has any further questions regarding new biologic therapies. She had recent TB and Hep B testing in May. For now will continue with Apriso at current dose and check basic labs next month.    PLAN:  Continue with apriso 1.125g QID for now  2. CMP, CBC w diff in July  3. Patient to consider new therapy for UC Thompson Grayer, Stelara, Velsipity or ozanimod)  All questions were answered, patient verbalized understanding and is in agreement with plan as outlined above.   Follow Up: 3 months   Alcee Sipos L. Jeanmarie Hubert, MSN, APRN, AGNP-C Adult-Gerontology Nurse Practitioner [facility] for [facility]  I have reviewed the note and agree with the APP's assessment as described in this progress note  Katrinka Blazing, MD Gastroenterology and Hepatology [facility] Gastroenterology

## 2023-02-04 NOTE — Patient Instructions (Addendum)
Please continue your apriso 1.125g 4 times per day  As discussed, given your disease was active on colonoscopy, I would recommend we switch to a different therapy that will hopefully control your disease better.  Nicole Cella, Velsipity or ozanimod are the medications that we think would be best for you, please take a look at these and let me know if you wish to proceed with changing therapy or if you have any other questions  I have ordered some labs to be completed next month   Follow up 3 months

## 2023-02-08 ENCOUNTER — Telehealth (INDEPENDENT_AMBULATORY_CARE_PROVIDER_SITE_OTHER): Payer: Self-pay

## 2023-02-08 NOTE — Telephone Encounter (Signed)
Patient called back today and wanted to let [facility] know she was not going to start any new medications/ Biologics. She feels she is doing well on the Apriso and that is what she plans on staying on. Patient wanted to know if she is not going to go on any new medication does she still need to do the blood work you were going to order. Please advise.      02/04/2023: Please continue your apriso 1.125g 4 times per day  As discussed, given your disease was active on colonoscopy, I would recommend we switch to a different therapy that will hopefully control your disease better.   Nicole Cella, Velsipity or ozanimod are the medications that we think would be best for you, please take a look at these and let me know if you wish to proceed with changing therapy or if you have any other questions   I have ordered some labs to be completed next month

## 2023-02-08 NOTE — Telephone Encounter (Signed)
Patient made aware she will need to complete lab work.

## 2023-03-02 ENCOUNTER — Other Ambulatory Visit (HOSPITAL_COMMUNITY): Payer: Self-pay

## 2023-03-15 ENCOUNTER — Other Ambulatory Visit (HOSPITAL_COMMUNITY): Payer: Self-pay

## 2023-05-03 ENCOUNTER — Other Ambulatory Visit: Payer: Self-pay

## 2023-05-03 ENCOUNTER — Other Ambulatory Visit (HOSPITAL_COMMUNITY): Payer: Self-pay

## 2023-05-03 ENCOUNTER — Other Ambulatory Visit: Payer: Self-pay | Admitting: Family Medicine

## 2023-05-03 MED ORDER — AMLODIPINE BESYLATE 2.5 MG PO TABS
2.5000 mg | ORAL_TABLET | Freq: Every day | ORAL | 0 refills | Status: DC
  Filled 2023-05-03: qty 90, 90d supply, fill #0

## 2023-05-26 ENCOUNTER — Encounter (INDEPENDENT_AMBULATORY_CARE_PROVIDER_SITE_OTHER): Payer: Self-pay

## 2023-05-27 ENCOUNTER — Ambulatory Visit (INDEPENDENT_AMBULATORY_CARE_PROVIDER_SITE_OTHER): Payer: Commercial Managed Care - PPO | Admitting: Gastroenterology

## 2023-06-01 ENCOUNTER — Other Ambulatory Visit: Payer: Self-pay | Admitting: Family Medicine

## 2023-06-01 ENCOUNTER — Other Ambulatory Visit: Payer: Self-pay

## 2023-06-01 ENCOUNTER — Other Ambulatory Visit (HOSPITAL_COMMUNITY): Payer: Self-pay

## 2023-06-01 DIAGNOSIS — E785 Hyperlipidemia, unspecified: Secondary | ICD-10-CM

## 2023-06-01 MED ORDER — ROSUVASTATIN CALCIUM 10 MG PO TABS
10.0000 mg | ORAL_TABLET | Freq: Every day | ORAL | 3 refills | Status: DC
Start: 2023-06-01 — End: 2024-05-11
  Filled 2023-06-01: qty 90, 90d supply, fill #0
  Filled 2023-09-05: qty 90, 90d supply, fill #1
  Filled 2023-12-03: qty 90, 90d supply, fill #2
  Filled 2024-02-29: qty 90, 90d supply, fill #3

## 2023-06-14 ENCOUNTER — Encounter (INDEPENDENT_AMBULATORY_CARE_PROVIDER_SITE_OTHER): Payer: Self-pay | Admitting: Gastroenterology

## 2023-06-14 ENCOUNTER — Other Ambulatory Visit (HOSPITAL_COMMUNITY): Payer: Self-pay

## 2023-06-14 ENCOUNTER — Ambulatory Visit (INDEPENDENT_AMBULATORY_CARE_PROVIDER_SITE_OTHER): Payer: Commercial Managed Care - PPO | Admitting: Gastroenterology

## 2023-06-14 VITALS — BP 134/71 | HR 94 | Temp 98.5°F | Ht 64.0 in | Wt 168.2 lb

## 2023-06-14 DIAGNOSIS — K51811 Other ulcerative colitis with rectal bleeding: Secondary | ICD-10-CM

## 2023-06-14 DIAGNOSIS — K529 Noninfective gastroenteritis and colitis, unspecified: Secondary | ICD-10-CM

## 2023-06-14 DIAGNOSIS — K5100 Ulcerative (chronic) pancolitis without complications: Secondary | ICD-10-CM

## 2023-06-14 DIAGNOSIS — K5120 Ulcerative (chronic) proctitis without complications: Secondary | ICD-10-CM

## 2023-06-14 MED ORDER — MESALAMINE ER 0.375 G PO CP24
1.1250 g | ORAL_CAPSULE | Freq: Four times a day (QID) | ORAL | 3 refills | Status: DC
Start: 2023-06-14 — End: 2024-06-22
  Filled 2023-06-14 (×2): qty 1080, 90d supply, fill #0
  Filled 2023-10-14 – 2023-10-20 (×2): qty 1080, 90d supply, fill #1
  Filled 2024-01-31: qty 1080, 90d supply, fill #2
  Filled 2024-05-08: qty 1080, 90d supply, fill #3

## 2023-06-14 NOTE — Patient Instructions (Addendum)
Continue with apriso 1.125g 4 times per day We will update basic labs and inflammatory markers Please let me know if you have any new or worsening GI issues  Follow up 6 months

## 2023-06-14 NOTE — Progress Notes (Unsigned)
Referring Provider: Gilmore Laroche, FNP Primary Care Physician:  Gilmore Laroche, FNP Primary GI Physician: Dr. Levon Hedger   Chief Complaint  Patient presents with   Follow-up    Patient here today for a follow up appointment. Patient denies any current issues.    HPI:   Barbara Martin is a 62 y.o. female with past medical history of ulcerative colitis, HLD, asthma, HLD   Patient presenting today for follow up of UC  Last seen June 2024, at that time doing well since steroid taper. No further rectal bleeding. No abdominal pain, having 1 Formed stool per day. not doing mesalamine suppository, she did them for a month and then stopped as she did not know she was supposed to continue this.   Recommended to continue apriso 1.125g QID, cmp, CBC, consider new therapy   CBC and CMP not completed  Present:  Doing well currently, denies any abdominal pain, rectal bleeding or diarrhea. Having 1 Formed stool per day.  Appetite is good. Weight is stable.    Extraintestinal Manifestations: Skin: none  Joints: none  Eyes: none   Last flu shot:2024 Last pneumonia shot: never Last Pap smear: January 2024 Last evaluation by dermatology: has upcoming appt in January  Last zoster vaccine: possibly 5 years ago Last DEXA scan: never, had steroids for short period in the past COVID-19 shot: Pfizer, 2 boosters  Last Colonoscopy:11/2022  - Perianal skin tags found on perianal exam.                           - One 5 mm polyp in the ascending colon, removed                            with a cold snare. Resected and retrieved.                           - Moderately active (Mayo Score 2) ulcerative                            colitis up to 30 cm, unchanged since the last                            examination. Biopsied-chronic active colitis    Recommendations:  Repeat colonoscopy 3 years   Past Medical History:  Diagnosis Date   Asthma    Hyperlipidemia    Rectal bleeding    Ulcerative  colitis [facility])     Past Surgical History:  Procedure Laterality Date   BIOPSY  11/25/2022   Procedure: BIOPSY;  Surgeon: Dolores Frame, MD;  [facility];  Service: Gastroenterology;;   COLONOSCOPY N/A 09/04/2015   Procedure: COLONOSCOPY;  Surgeon: Malissa Hippo, MD;  [facility];  Service: Endoscopy;  Laterality: N/A;  1:15 - moved to 12:15 - Ann to notify   COLONOSCOPY WITH PROPOFOL N/A 11/25/2022   Procedure: COLONOSCOPY WITH PROPOFOL;  Surgeon: Dolores Frame, MD;  [facility];  Service: Gastroenterology;  Laterality: N/A;  1:00pm, asa 1-2   POLYPECTOMY  11/25/2022   Procedure: POLYPECTOMY;  Surgeon: Dolores Frame, MD;  [facility];  Service: Gastroenterology;;   TUBAL LIGATION     1992   WRIST SURGERY     rt  wrist surgery x 2 for a varicose vein    Current Outpatient Medications  Medication Sig Dispense Refill   amLODipine (NORVASC) 2.5 MG tablet Take 1 tablet (2.5 mg total) by mouth daily. 90 tablet 0   mesalamine (APRISO) 0.375 g 24 hr capsule Take 3 capsules (1.125 g total) by mouth in the morning, at noon, in the evening, and at bedtime. 1080 capsule 0   Multiple Vitamin (MULTIVITAMIN) tablet Take 1 tablet by mouth daily.     rosuvastatin (CRESTOR) 10 MG tablet Take 1 tablet (10 mg total) by mouth daily. 90 tablet 3   Vitamin D, Cholecalciferol, 10 MCG (400 UNIT) CAPS Take 400 Units by mouth daily.     mesalamine (CANASA) 1000 MG suppository Place 1 suppository (1,000 mg total) rectally at bedtime every night for a month, then every other night. (Patient not taking: Reported on 06/14/2023) 30 suppository 1   No current facility-administered medications for this visit.    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  Tobacco Use   Smoking status: Never    Passive exposure: Never   Smokeless tobacco: Never  Vaping Use   Vaping status: Never Used  Substance and Sexual Activity   Alcohol use: No   Drug use: No   Sexual activity: Yes    Birth control/protection: Surgical  Other Topics Concern   Not on file  Social History Narrative   Not on file   Social Determinants of Health   Financial Resource Strain: Not on file  Food Insecurity: Not on file  Transportation Needs: Not on file  Physical Activity: Not on file  Stress: Not on file  Social Connections: Not on file    Review of systems General: negative for malaise, night sweats, fever, chills, weight los Neck: Negative for lumps, goiter, pain and significant neck swelling Resp: Negative for cough, wheezing, dyspnea at rest CV: Negative for chest pain, leg swelling, palpitations, orthopnea GI: denies melena, hematochezia, nausea, vomiting, diarrhea, constipation, dysphagia, odyonophagia, early satiety or unintentional weight loss.  MSK: Negative for joint pain or swelling, back pain, and muscle pain. Derm: Negative for itching or rash Psych: Denies depression, anxiety, memory loss, confusion. No homicidal or suicidal ideation.  Heme: Negative for prolonged bleeding, bruising easily, and swollen nodes. Endocrine: Negative for cold or heat intolerance, polyuria, polydipsia and goiter. Neuro: negative for tremor, gait imbalance, syncope and seizures. The remainder of the review of systems is noncontributory.  Physical  Exam: BP 134/71 (BP Location: Left Arm, Patient Position: Sitting, Cuff Size: Large)   Pulse 94   Temp 98.5 F (36.9 C) (Oral)   Ht 5\' 4"  (1.626 m)  Wt 168 lb 3.2 oz (76.3 kg)   LMP 02/01/2011   BMI 28.87 kg/m  General:   Alert and oriented. No distress noted. Pleasant and cooperative.  Head:  Normocephalic and atraumatic. Eyes:  Conjuctiva clear without scleral icterus. Mouth:  Oral mucosa pink and moist. Good dentition. No lesions. Heart: Normal rate and rhythm, s1 and s2 heart sounds present.  Lungs: Clear lung sounds in all lobes. Respirations equal and unlabored. Abdomen:  +BS, soft, non-tender and non-distended. No rebound or guarding. No HSM or masses noted. Derm: No palmar erythema or jaundice Msk:  Symmetrical without gross deformities. Normal posture. Extremities:  Without edema. Neurologic:  Alert and  oriented x4 Psych:  Alert and cooperative. Normal mood and affect.  Invalid input(s): "6 MONTHS"   ASSESSMENT: Barbara Martin is a 62 y.o. female presenting today    PLAN:  CBC, CMP  2. CRP, fecal calprotectin  3. Continue apriso   All questions were answered, patient verbalized understanding and is in agreement with plan as outlined above.    Follow Up: 6 months   Mearl Olver L. Jeanmarie Hubert, MSN, APRN, AGNP-C Adult-Gerontology Nurse Practitioner [facility] for GI Diseases

## 2023-06-15 ENCOUNTER — Other Ambulatory Visit (HOSPITAL_COMMUNITY): Payer: Self-pay

## 2023-06-15 ENCOUNTER — Other Ambulatory Visit: Payer: Self-pay

## 2023-06-16 ENCOUNTER — Other Ambulatory Visit (HOSPITAL_COMMUNITY): Payer: Self-pay

## 2023-06-23 ENCOUNTER — Other Ambulatory Visit (HOSPITAL_COMMUNITY): Payer: Self-pay

## 2023-06-25 DIAGNOSIS — K5100 Ulcerative (chronic) pancolitis without complications: Secondary | ICD-10-CM | POA: Diagnosis not present

## 2023-06-26 LAB — CBC
Hematocrit: 46.1 % (ref 34.0–46.6)
Hemoglobin: 14.6 g/dL (ref 11.1–15.9)
MCH: 29.3 pg (ref 26.6–33.0)
MCHC: 31.7 g/dL (ref 31.5–35.7)
MCV: 92 fL (ref 79–97)
Platelets: 305 10*3/uL (ref 150–450)
RBC: 4.99 x10E6/uL (ref 3.77–5.28)
RDW: 13.2 % (ref 11.7–15.4)
WBC: 8.8 10*3/uL (ref 3.4–10.8)

## 2023-06-26 LAB — COMPREHENSIVE METABOLIC PANEL
ALT: 28 [IU]/L (ref 0–32)
AST: 26 [IU]/L (ref 0–40)
Albumin: 4.6 g/dL (ref 3.9–4.9)
Alkaline Phosphatase: 85 [IU]/L (ref 44–121)
BUN/Creatinine Ratio: 17 (ref 12–28)
BUN: 13 mg/dL (ref 8–27)
Bilirubin Total: 0.3 mg/dL (ref 0.0–1.2)
CO2: 24 mmol/L (ref 20–29)
Calcium: 10 mg/dL (ref 8.7–10.3)
Chloride: 101 mmol/L (ref 96–106)
Creatinine, Ser: 0.76 mg/dL (ref 0.57–1.00)
Globulin, Total: 2.9 g/dL (ref 1.5–4.5)
Glucose: 86 mg/dL (ref 70–99)
Potassium: 4.5 mmol/L (ref 3.5–5.2)
Sodium: 138 mmol/L (ref 134–144)
Total Protein: 7.5 g/dL (ref 6.0–8.5)
eGFR: 88 mL/min/{1.73_m2} (ref >59)

## 2023-06-26 LAB — C-REACTIVE PROTEIN: CRP: <1 mg/L (ref 0–10)

## 2023-06-28 ENCOUNTER — Other Ambulatory Visit (INDEPENDENT_AMBULATORY_CARE_PROVIDER_SITE_OTHER): Payer: Self-pay | Admitting: Gastroenterology

## 2023-06-28 DIAGNOSIS — K5100 Ulcerative (chronic) pancolitis without complications: Secondary | ICD-10-CM | POA: Diagnosis not present

## 2023-07-01 LAB — CALPROTECTIN, FECAL: Calprotectin, Fecal: 295 ug/g — ABNORMAL HIGH (ref 0–120)

## 2023-07-07 ENCOUNTER — Encounter (INDEPENDENT_AMBULATORY_CARE_PROVIDER_SITE_OTHER): Payer: Self-pay

## 2023-07-28 ENCOUNTER — Other Ambulatory Visit: Payer: Self-pay

## 2023-07-28 ENCOUNTER — Other Ambulatory Visit (HOSPITAL_COMMUNITY): Payer: Self-pay

## 2023-07-28 ENCOUNTER — Other Ambulatory Visit: Payer: Self-pay | Admitting: Family Medicine

## 2023-07-28 MED ORDER — AMLODIPINE BESYLATE 2.5 MG PO TABS
2.5000 mg | ORAL_TABLET | Freq: Every day | ORAL | 0 refills | Status: DC
  Filled 2023-07-28: qty 90, 90d supply, fill #0

## 2023-09-06 ENCOUNTER — Other Ambulatory Visit (HOSPITAL_COMMUNITY): Payer: Self-pay

## 2023-10-07 ENCOUNTER — Other Ambulatory Visit (HOSPITAL_COMMUNITY): Payer: Self-pay | Admitting: Family Medicine

## 2023-10-07 DIAGNOSIS — Z1231 Encounter for screening mammogram for malignant neoplasm of breast: Secondary | ICD-10-CM

## 2023-10-14 ENCOUNTER — Other Ambulatory Visit (HOSPITAL_COMMUNITY): Payer: Self-pay

## 2023-10-15 ENCOUNTER — Other Ambulatory Visit (HOSPITAL_COMMUNITY): Payer: Self-pay

## 2023-10-15 ENCOUNTER — Other Ambulatory Visit: Payer: Self-pay

## 2023-10-18 ENCOUNTER — Other Ambulatory Visit (HOSPITAL_COMMUNITY): Payer: Self-pay

## 2023-10-19 ENCOUNTER — Other Ambulatory Visit (HOSPITAL_COMMUNITY): Payer: Self-pay

## 2023-10-20 ENCOUNTER — Other Ambulatory Visit: Payer: Self-pay

## 2023-10-20 ENCOUNTER — Other Ambulatory Visit (HOSPITAL_COMMUNITY): Payer: Self-pay

## 2023-10-25 ENCOUNTER — Other Ambulatory Visit (HOSPITAL_COMMUNITY): Payer: Self-pay

## 2023-10-25 ENCOUNTER — Other Ambulatory Visit: Payer: Self-pay | Admitting: Family Medicine

## 2023-10-25 ENCOUNTER — Other Ambulatory Visit: Payer: Self-pay

## 2023-10-25 MED ORDER — AMLODIPINE BESYLATE 2.5 MG PO TABS
2.5000 mg | ORAL_TABLET | Freq: Every day | ORAL | 0 refills | Status: DC
  Filled 2023-10-25: qty 90, 90d supply, fill #0

## 2023-10-28 ENCOUNTER — Other Ambulatory Visit (HOSPITAL_COMMUNITY): Payer: Self-pay

## 2023-11-01 ENCOUNTER — Ambulatory Visit (HOSPITAL_COMMUNITY)
Admission: RE | Admit: 2023-11-01 | Discharge: 2023-11-01 | Disposition: A | Discharge status: Home / Self Care | Payer: Commercial Managed Care - PPO | Source: Ambulatory Visit | Attending: Family Medicine | Admitting: Family Medicine

## 2023-11-01 ENCOUNTER — Encounter (HOSPITAL_COMMUNITY): Payer: Self-pay

## 2023-11-01 DIAGNOSIS — Z1231 Encounter for screening mammogram for malignant neoplasm of breast: Secondary | ICD-10-CM

## 2023-11-18 DIAGNOSIS — H524 Presbyopia: Secondary | ICD-10-CM | POA: Diagnosis not present

## 2023-12-03 ENCOUNTER — Other Ambulatory Visit (HOSPITAL_COMMUNITY): Payer: Self-pay

## 2023-12-13 ENCOUNTER — Ambulatory Visit (INDEPENDENT_AMBULATORY_CARE_PROVIDER_SITE_OTHER): Payer: Commercial Managed Care - PPO | Admitting: Gastroenterology

## 2023-12-13 ENCOUNTER — Encounter (INDEPENDENT_AMBULATORY_CARE_PROVIDER_SITE_OTHER): Payer: Self-pay | Admitting: Gastroenterology

## 2023-12-13 VITALS — BP 123/72 | HR 84 | Temp 98.6°F | Ht 64.0 in | Wt 171.4 lb

## 2023-12-13 DIAGNOSIS — K5100 Ulcerative (chronic) pancolitis without complications: Secondary | ICD-10-CM

## 2023-12-13 DIAGNOSIS — E559 Vitamin D deficiency, unspecified: Secondary | ICD-10-CM

## 2023-12-13 DIAGNOSIS — Z1321 Encounter for screening for nutritional disorder: Secondary | ICD-10-CM

## 2023-12-13 NOTE — Patient Instructions (Signed)
 We will recheck stool inflammatory marker and vitamin D  levels Please continue on apriso  4.5g total daily ( you can do 1.5g three times per day)  Follow up 6 months  It was a pleasure to see you today. I want to create trusting relationships with patients and provide genuine, compassionate, and quality care. I truly value your feedback! please be on the lookout for a survey regarding your visit with me today. I appreciate your input about our visit and your time in completing this!    Dezmond Downie L. Zain Bingman, MSN, APRN, AGNP-C Adult-Gerontology Nurse Practitioner [facility] Gastroenterology at [facility]

## 2023-12-13 NOTE — Progress Notes (Addendum)
 Referring Provider: Zarwolo, Gloria, FNP Primary Care Physician:  Zarwolo, Gloria, FNP Primary GI Physician: Dr. Sammi Crick   Chief Complaint  Patient presents with   ulcerative pancolitis    Follow up on ulcerative pancolitis. States no concerns.     HPI:   Barbara Martin is a 63 y.o. female with past medical history of  ulcerative colitis, HLD, asthma, HLD   Patient presenting today for:  Follow up of UC  Last seen October 2024, at that time, doing well, having 1 formed stools per day, no abdominal pain, rectal bleeding or diarrhea  Recommended to have CBC, CMP, crp, fecal calprotectin, continue apriso  1.125g QID, consider other therapies if inflammatory markers elevated.   Labs on 11/1 with CRP <1, Calprotectin 295, CBC and CMP grossly unremarkable Discussed with patient recommendations for other therapies given elevated calprotectin on highest apriso  dose, however patient reported she was only taking 2 pills in the morning then 2 more later in the day, discussed that she was only taking a fraction of her prescribed dose and advised to take 4.5g total daily  Present: Doing well today. States she did increase her apriso  as discussed after last labs. She is feeling good, 1 formed stool per day without rectal bleeding or abdominal pain. Occasionally will skip a day but denies any constipation. No red flag symptoms. Patient denies melena, hematochezia, nausea, vomiting, diarrhea, constipation, dysphagia, odyonophagia, early satiety or weight loss.    Extraintestinal Manifestations: Skin: none  Joints: none  Eyes: none   Last flu shot:2024 Last pneumonia shot: never Last Pap smear: January 2024 Last evaluation by dermatology: has upcoming appt in January  Last zoster vaccine: possibly 5 years ago Last DEXA scan: never, had steroids for short period in the past COVID-19 shot: Pfizer, 2 boosters   Last Colonoscopy:11/2022  - Perianal skin tags found on perianal exam.                            - One 5 mm polyp in the ascending colon, removed                            with a cold snare. Resected and retrieved.                           - Moderately active (Mayo Score 2) ulcerative                            colitis up to 30 cm, unchanged since the last                            examination. Biopsied-chronic active colitis    Recommendations:  Repeat colonoscopy 3 years Filed Weights   12/13/23 0850  Weight: 171 lb 6.4 oz (77.7 kg)     Past Medical History:  Diagnosis Date   Asthma    Hyperlipidemia    Rectal bleeding    Ulcerative colitis [facility])     Past Surgical History:  Procedure Laterality Date   BIOPSY  11/25/2022   Procedure: BIOPSY;  Surgeon: Urban Garden, MD;  [facility];  Service: Gastroenterology;;   COLONOSCOPY N/A 09/04/2015   Procedure: COLONOSCOPY;  Surgeon: Ruby Corporal, MD;  [facility];  Service: Endoscopy;  Laterality: N/A;  1:15 - moved to 12:15 - Ann to notify   COLONOSCOPY WITH PROPOFOL  N/A 11/25/2022   Procedure: COLONOSCOPY WITH PROPOFOL ;  Surgeon: Urban Garden, MD;  [facility];  Service: Gastroenterology;  Laterality: N/A;  1:00pm, asa 1-2   POLYPECTOMY  11/25/2022   Procedure: POLYPECTOMY;  Surgeon: Umberto Ganong, Bearl Limes, MD;  [facility];  Service: Gastroenterology;;   TUBAL LIGATION     1992   WRIST SURGERY     rt wrist surgery x 2 for a varicose vein    Current Outpatient Medications  Medication Sig Dispense Refill   amLODipine  (NORVASC ) 2.5 MG tablet Take 1 tablet (2.5 mg total) by mouth daily. 90 tablet 0   loratadine (CLARITIN) 10 MG tablet Take 10 mg by mouth daily.     mesalamine  (APRISO ) 0.375 g 24 hr capsule Take 3 capsules (1.125 g total) by mouth in the morning, at noon, in the evening, and at bedtime. 1080 capsule 3   mesalamine  (CANASA ) 1000 MG suppository Place 1 suppository (1,000 mg total) rectally at bedtime every night for a  month, then every other night. 30 suppository 1   Multiple Vitamin (MULTIVITAMIN) tablet Take 1 tablet by mouth daily.     rosuvastatin  (CRESTOR ) 10 MG tablet Take 1 tablet (10 mg total) by mouth daily. 90 tablet 3   Vitamin D , Cholecalciferol, 10 MCG (400 UNIT) CAPS Take 400 Units by mouth daily.     No current facility-administered medications for this visit.    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  Tobacco Use   Smoking status: Never    Passive exposure: Never   Smokeless tobacco: Never  Vaping Use   Vaping status: Never Used  Substance and Sexual Activity   Alcohol use: No   Drug use: No   Sexual activity: Yes    Birth control/protection: Surgical  Other Topics Concern   Not on file  Social History Narrative   Not on file   Social Drivers of Health   Financial Resource Strain: Not on file  Food Insecurity: Not on file  Transportation Needs: Not on file  Physical Activity: Not on file  Stress: Not on file  Social Connections: Not on file    Review of systems General: negative for malaise, night sweats, fever, chills, weight loss Neck: Negative for lumps, goiter, pain and significant neck swelling Resp: Negative for cough, wheezing, dyspnea at rest CV: Negative for chest pain, leg swelling, palpitations, orthopnea GI: denies melena, hematochezia, nausea, vomiting, diarrhea, constipation, dysphagia, odyonophagia, early satiety or unintentional weight loss.  MSK: Negative for joint pain or swelling, back pain, and muscle pain. Derm: Negative for itching or rash Psych: Denies depression, anxiety, memory loss, confusion. No homicidal or suicidal ideation.  Heme: Negative for prolonged bleeding,  bruising easily, and swollen nodes. Endocrine: Negative for cold or heat intolerance, polyuria, polydipsia and goiter. Neuro: negative for tremor, gait imbalance, syncope and seizures. The remainder of the review of systems is noncontributory.  Physical Exam: BP 123/72   Pulse 84   Temp 98.6 F (37 C) (Oral)   Ht 5\' 4"  (1.626 m)  Wt 171 lb 6.4 oz (77.7 kg)   LMP 02/01/2011   BMI 29.42 kg/m  General:   Alert and oriented. No distress noted. Pleasant and cooperative.  Head:  Normocephalic and atraumatic. Eyes:  Conjuctiva clear without scleral icterus. Mouth:  Oral mucosa pink and moist. Good dentition. No lesions. Heart: Normal rate and rhythm, s1 and s2 heart sounds present.  Lungs: Clear lung sounds in all lobes. Respirations equal and unlabored. Abdomen:  +BS, soft, non-tender and non-distended. No rebound or guarding. No HSM or masses noted. Derm: No palmar erythema or jaundice Msk:  Symmetrical without gross deformities. Normal posture. Extremities:  Without edema. Neurologic:  Alert and  oriented x4 Psych:  Alert and cooperative. Normal mood and affect.  Invalid input(s): "6 MONTHS"   ASSESSMENT: Barbara Martin is a 63 y.o. female presenting today for follow up of ulcerative pancolitis   She had elevated fecal calprotectin of 295 in November, at that time it was discovered she 5 g of her Apriso  versus her prescribed 4.5 g.  She was advised to increase her Apriso  to 4.5 g which she did. Patient doing well with 1 formed stool today, no abdominal pain, diarrhea or rectal bleeding.  Last colonoscopy in 2024 with a mild active colitis.  Changes therapy from mesalamine  has been discussed in the past the patient was hesitant to switch to anything else.  At this time would recommend rechecking calprotectin as she has increased her Apriso .  Will also check vitamin D  levels as these have not been checked in over a year.  She should let me know if she has any abdominal pain, rectal  bleeding or diarrhea.   PLAN:  -fecal calprotectin -continue Apriso  1.5g TID/4.5 g total daily - Vitamin D  levels - Continue supplemental vitamin D   All questions were answered, patient verbalized understanding and is in agreement with plan as outlined above.    Follow Up: 6 months   Margie Brink L. Adrien Alberta, MSN, APRN, AGNP-C Adult-Gerontology Nurse Practitioner [facility]  I have reviewed the note and agree with the APP's assessment as described in this progress note  Samantha Cress, MD Gastroenterology and Hepatology [facility]

## 2023-12-14 DIAGNOSIS — E559 Vitamin D deficiency, unspecified: Secondary | ICD-10-CM | POA: Diagnosis not present

## 2023-12-14 DIAGNOSIS — K5100 Ulcerative (chronic) pancolitis without complications: Secondary | ICD-10-CM | POA: Diagnosis not present

## 2023-12-14 DIAGNOSIS — Z1321 Encounter for screening for nutritional disorder: Secondary | ICD-10-CM | POA: Diagnosis not present

## 2023-12-15 ENCOUNTER — Other Ambulatory Visit (INDEPENDENT_AMBULATORY_CARE_PROVIDER_SITE_OTHER): Payer: Self-pay | Admitting: Gastroenterology

## 2023-12-15 DIAGNOSIS — K5100 Ulcerative (chronic) pancolitis without complications: Secondary | ICD-10-CM

## 2023-12-15 LAB — C-REACTIVE PROTEIN: CRP: <3 mg/L (ref <8.0)

## 2023-12-15 LAB — VITAMIN D 25 HYDROXY (VIT D DEFICIENCY, FRACTURES): Vit D, 25-Hydroxy: 45 ng/mL (ref 30–100)

## 2023-12-17 DIAGNOSIS — K5100 Ulcerative (chronic) pancolitis without complications: Secondary | ICD-10-CM | POA: Diagnosis not present

## 2023-12-23 LAB — CALPROTECTIN: Calprotectin: 79 ug/g

## 2023-12-27 ENCOUNTER — Encounter (INDEPENDENT_AMBULATORY_CARE_PROVIDER_SITE_OTHER): Payer: Self-pay

## 2024-01-31 ENCOUNTER — Other Ambulatory Visit: Payer: Self-pay | Admitting: Family Medicine

## 2024-01-31 ENCOUNTER — Other Ambulatory Visit: Payer: Self-pay

## 2024-01-31 ENCOUNTER — Other Ambulatory Visit (HOSPITAL_COMMUNITY): Payer: Self-pay

## 2024-02-04 ENCOUNTER — Other Ambulatory Visit (HOSPITAL_COMMUNITY): Payer: Self-pay

## 2024-02-07 ENCOUNTER — Other Ambulatory Visit (HOSPITAL_COMMUNITY): Payer: Self-pay

## 2024-02-07 ENCOUNTER — Other Ambulatory Visit: Payer: Self-pay | Admitting: Family Medicine

## 2024-02-07 MED ORDER — AMLODIPINE BESYLATE 2.5 MG PO TABS
2.5000 mg | ORAL_TABLET | Freq: Every day | ORAL | 0 refills | Status: DC
  Filled 2024-02-07 – 2024-02-08 (×2): qty 30, 30d supply, fill #0

## 2024-02-07 NOTE — Telephone Encounter (Signed)
 Copied from CRM [id number]. Topic: Clinical - Medication Refill >> Feb 07, 2024  9:54 AM Kevelyn M wrote: Medication: amLODipine  (NORVASC ) 2.5 MG tablet   Has the patient contacted their pharmacy? Yes (Agent: If no, request that the patient contact the pharmacy for the refill. If patient does not wish to contact the pharmacy document the reason why and proceed with request.) (Agent: If yes, when and what did the pharmacy advise?)  This is the patient's preferred pharmacy:  [facility] [address], [address] [place] [address] Phone: [phone] Fax: [phone]   Is this the correct pharmacy for this prescription? Yes If no, delete pharmacy and type the correct one.   Has the prescription been filled recently? No  Is the patient out of the medication? Yes  Has the patient been seen for an appointment in the last year OR does the patient have an upcoming appointment? No  Can we respond through MyChart? Yes  Agent: Please be advised that Rx refills may take up to 3 business days. We ask that you follow-up with your pharmacy.

## 2024-02-08 ENCOUNTER — Other Ambulatory Visit (HOSPITAL_COMMUNITY): Payer: Self-pay

## 2024-02-09 ENCOUNTER — Other Ambulatory Visit: Payer: Self-pay

## 2024-02-09 ENCOUNTER — Other Ambulatory Visit (HOSPITAL_COMMUNITY): Payer: Self-pay

## 2024-02-29 ENCOUNTER — Other Ambulatory Visit: Payer: Self-pay | Admitting: Family Medicine

## 2024-02-29 ENCOUNTER — Other Ambulatory Visit (HOSPITAL_COMMUNITY): Payer: Self-pay

## 2024-02-29 MED ORDER — AMLODIPINE BESYLATE 2.5 MG PO TABS
2.5000 mg | ORAL_TABLET | Freq: Every day | ORAL | 0 refills | Status: DC
  Filled 2024-02-29: qty 30, 30d supply, fill #0

## 2024-04-10 ENCOUNTER — Encounter: Payer: Self-pay | Admitting: Family Medicine

## 2024-05-08 ENCOUNTER — Other Ambulatory Visit: Payer: Self-pay

## 2024-05-08 ENCOUNTER — Other Ambulatory Visit (HOSPITAL_COMMUNITY): Payer: Self-pay

## 2024-05-09 ENCOUNTER — Other Ambulatory Visit: Payer: Self-pay

## 2024-05-11 ENCOUNTER — Other Ambulatory Visit (HOSPITAL_COMMUNITY): Payer: Self-pay

## 2024-05-11 ENCOUNTER — Other Ambulatory Visit: Payer: Self-pay

## 2024-05-11 ENCOUNTER — Encounter: Payer: Self-pay | Admitting: Family Medicine

## 2024-05-11 ENCOUNTER — Ambulatory Visit (INDEPENDENT_AMBULATORY_CARE_PROVIDER_SITE_OTHER): Payer: Self-pay | Admitting: Family Medicine

## 2024-05-11 VITALS — BP 137/73 | HR 82 | Resp 18 | Ht 64.0 in | Wt 169.1 lb

## 2024-05-11 DIAGNOSIS — E785 Hyperlipidemia, unspecified: Secondary | ICD-10-CM | POA: Diagnosis not present

## 2024-05-11 DIAGNOSIS — Z0001 Encounter for general adult medical examination with abnormal findings: Secondary | ICD-10-CM | POA: Diagnosis not present

## 2024-05-11 DIAGNOSIS — E782 Mixed hyperlipidemia: Secondary | ICD-10-CM

## 2024-05-11 DIAGNOSIS — I10 Essential (primary) hypertension: Secondary | ICD-10-CM | POA: Diagnosis not present

## 2024-05-11 DIAGNOSIS — R7301 Impaired fasting glucose: Secondary | ICD-10-CM | POA: Diagnosis not present

## 2024-05-11 DIAGNOSIS — Z23 Encounter for immunization: Secondary | ICD-10-CM | POA: Diagnosis not present

## 2024-05-11 DIAGNOSIS — E038 Other specified hypothyroidism: Secondary | ICD-10-CM | POA: Diagnosis not present

## 2024-05-11 DIAGNOSIS — E559 Vitamin D deficiency, unspecified: Secondary | ICD-10-CM | POA: Diagnosis not present

## 2024-05-11 MED ORDER — ROSUVASTATIN CALCIUM 10 MG PO TABS
10.0000 mg | ORAL_TABLET | Freq: Every day | ORAL | 3 refills | Status: AC
  Filled 2024-05-11 (×2): qty 90, 90d supply, fill #0
  Filled 2024-08-03 – 2024-08-05 (×2): qty 90, 90d supply, fill #1

## 2024-05-11 MED ORDER — AMLODIPINE BESYLATE 2.5 MG PO TABS
2.5000 mg | ORAL_TABLET | Freq: Every day | ORAL | 1 refills | Status: AC
  Filled 2024-05-11 (×2): qty 90, 90d supply, fill #0
  Filled 2024-08-03: qty 90, 90d supply, fill #1

## 2024-05-11 NOTE — Patient Instructions (Addendum)
 I appreciate the opportunity to provide care to you today!    Follow up:  1 year CPE 4 months follow up  Labs: please stop by the lab today to get your blood drawn (CBC, CMP, TSH, Lipid profile, HgA1c, Vit D)  For a Healthier YOU, I Recommend: Reducing your intake of sugar, sodium, carbohydrates, and saturated fats. Increasing your fiber intake by incorporating more whole grains, fruits, and vegetables into your meals. Setting healthy goals with a focus on lowering your consumption of carbs, sugar, and unhealthy fats. Adding variety to your diet by including a wide range of fruits and vegetables. Cutting back on soda and limiting processed foods as much as possible. Staying active: In addition to taking your weight loss medication, aim for at least 150 minutes of moderate-intensity physical activity each week for optimal results.    Please follow up if your symptoms worsen or fail to improve.   Please continue to a heart-healthy diet and increase your physical activities. Try to exercise for at least five days a week.    It was a pleasure to see you and I look forward to continuing to work together on your health and well-being. Please do not hesitate to call the office if you need care or have questions about your care.  In case of emergency, please visit the Emergency Department for urgent care, or contact our clinic at [phone] to schedule an appointment. We're here to help you!   Have a wonderful day and week. With Gratitude, Kiira Brach MSN, FNP-BC

## 2024-05-11 NOTE — Assessment & Plan Note (Signed)

## 2024-05-11 NOTE — Progress Notes (Signed)
 Complete physical exam  Patient: Barbara Martin   DOB: August 27, 1960   63 y.o. Female  MRN: [id number]  Subjective:    Chief Complaint  Patient presents with   Annual Exam    Cpe     Barbara Martin is a 63 y.o. female who presents today for a complete physical exam. She reports consuming a general diet. Walk about twice a week for 30 min She generally feels well. She reports sleeping well. She does not have additional problems to discuss today.    Most recent fall risk assessment:    05/11/2024    8:25 AM  Fall Risk   Falls in the past year? 0  Number falls in past yr: 0  Injury with Fall? 0  Follow up Falls evaluation completed     Most recent depression screenings:    05/11/2024    8:25 AM 10/02/2022   10:54 AM  PHQ 2/9 Scores  PHQ - 2 Score 0 0  PHQ- 9 Score  0    Dental: No current dental problems and Last dental visit: 03/2024  Patient Active Problem List   Diagnosis Date Noted   Encounter for general adult medical examination with abnormal findings 05/11/2024   Encounter for immunization 05/11/2024   Acute infectious diarrhea 10/26/2022   Pap smear for cervical cancer screening 06/15/2022   Allergies 12/03/2021   Overweight (BMI 25.0-29.9) 07/24/2021   Annual physical exam 07/24/2021   Hypertension 07/24/2021   Skin lesion 07/24/2021   Encounter to establish care 01/13/2021   Hyperlipidemia 03/31/2019   Multiple nevi 03/29/2019   UC (ulcerative colitis) (HCC) 11/12/2015   Asthma 09/02/2015   Past Medical History:  Diagnosis Date   Asthma    Hyperlipidemia    Rectal bleeding    Ulcerative colitis (HCC)    Past Surgical History:  Procedure Laterality Date   BIOPSY  11/25/2022   Procedure: BIOPSY;  Surgeon: Eartha Angelia Sieving, MD;  [facility];  Service: Gastroenterology;;   COLONOSCOPY N/A 09/04/2015   Procedure: COLONOSCOPY;  Surgeon: Claudis RAYMOND Rivet, MD;  [facility];  Service: Endoscopy;  Laterality: N/A;  1:15 -  moved to 12:15 - Ann to notify   COLONOSCOPY WITH PROPOFOL  N/A 11/25/2022   Procedure: COLONOSCOPY WITH PROPOFOL ;  Surgeon: Eartha Angelia Sieving, MD;  [facility];  Service: Gastroenterology;  Laterality: N/A;  1:00pm, asa 1-2   POLYPECTOMY  11/25/2022   Procedure: POLYPECTOMY;  Surgeon: Eartha Angelia Sieving, MD;  [facility];  Service: Gastroenterology;;   TUBAL LIGATION     1992   WRIST SURGERY     rt wrist surgery x 2 for a varicose vein   Social History   Tobacco Use   Smoking status: Never    Passive exposure: Never   Smokeless tobacco: Never  Vaping Use   Vaping status: Never Used  Substance Use Topics   Alcohol use: No   Drug use: No   Social History   Socioeconomic History   Marital status: Married    Spouse name: Not on file   Number of children: 1   Years of education: Not on file   Highest education level: Not on file  Occupational History   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  Tobacco Use   Smoking status: Never    Passive exposure: Never   Smokeless tobacco: Never  Vaping Use   Vaping status: Never Used  Substance and Sexual Activity  Alcohol use: No   Drug use: No   Sexual activity: Yes    Birth control/protection: Surgical  Other Topics Concern   Not on file  Social History Narrative   Not on file   Social Drivers of Health   Financial Resource Strain: Not on file  Food Insecurity: Not on file  Transportation Needs: Not on file  Physical Activity: Not on file  Stress: Not on file  Social Connections: Not on file  Intimate Partner Violence: Not on file   Family Status  Relation Name Status   Mother  Alive       good health   Father  Deceased       leukemia   Brother 2 Alive       diabetic, One deceased CAD   Sister 1 Alive       good health   MGM  Deceased   MGF  Deceased   PGM  Deceased   PGF  Deceased   Daughter  Alive       good health   Brother  Deceased   Other married Alive  No  partnership data on file   Family History  Problem Relation Age of Onset   Hyperlipidemia Mother    Hypertension Mother    Dementia Mother    Arthritis Father    Cancer Father        leukemia   Hyperlipidemia Father    Asthma Brother    Heart disease Brother    Diabetes Brother        Type 1 diabetes   Heart disease Brother 21       valve issue   No Known Allergies    Patient Care Team: Peter Keyworth, FNP as PCP - General (Family Medicine)   Outpatient Medications Prior to Visit  Medication Sig   loratadine (CLARITIN) 10 MG tablet Take 10 mg by mouth daily.   mesalamine  (APRISO ) 0.375 g 24 hr capsule Take 3 capsules (1.125 g total) by mouth in the morning, at noon, in the evening, and at bedtime.   mesalamine  (CANASA ) 1000 MG suppository Place 1 suppository (1,000 mg total) rectally at bedtime every night for a month, then every other night.   Multiple Vitamin (MULTIVITAMIN) tablet Take 1 tablet by mouth daily.   Vitamin D , Cholecalciferol, 10 MCG (400 UNIT) CAPS Take 400 Units by mouth daily.   [DISCONTINUED] amLODipine  (NORVASC ) 2.5 MG tablet Take 1 tablet (2.5 mg total) by mouth daily.   [DISCONTINUED] rosuvastatin  (CRESTOR ) 10 MG tablet Take 1 tablet (10 mg total) by mouth daily.   No facility-administered medications prior to visit.    Review of Systems  Constitutional:  Negative for chills, fever and malaise/fatigue.  HENT:  Negative for congestion and sinus pain.   Eyes:  Negative for pain, discharge and redness.  Respiratory:  Negative for cough, sputum production and shortness of breath.   Cardiovascular:  Negative for chest pain, palpitations, claudication and leg swelling.  Gastrointestinal:  Negative for diarrhea, heartburn and nausea.  Genitourinary:  Negative for flank pain and frequency.  Musculoskeletal:  Negative for back pain and joint pain.  Skin:  Negative for itching.  Neurological:  Negative for dizziness, seizures and headaches.   Endo/Heme/Allergies:  Negative for environmental allergies.  Psychiatric/Behavioral:  Negative for memory loss. The patient does not have insomnia.        Objective:    BP 137/73   Pulse 82   Resp 18   Ht 5' 4 (1.626  m)   Wt 169 lb 1.3 oz (76.7 kg)   LMP 02/01/2011   SpO2 96%   BMI 29.02 kg/m  BP Readings from Last 3 Encounters:  05/11/24 137/73  12/13/23 123/72  06/14/23 134/71   Wt Readings from Last 3 Encounters:  05/11/24 169 lb 1.3 oz (76.7 kg)  12/13/23 171 lb 6.4 oz (77.7 kg)  06/14/23 168 lb 3.2 oz (76.3 kg)      Physical Exam HENT:     Head: Normocephalic.     Left Ear: External ear normal.     Mouth/Throat:     Mouth: Mucous membranes are moist.  Eyes:     Extraocular Movements: Extraocular movements intact.     Pupils: Pupils are equal, round, and reactive to light.  Cardiovascular:     Rate and Rhythm: Normal rate and regular rhythm.     Heart sounds: No murmur heard. Pulmonary:     Effort: Pulmonary effort is normal.     Breath sounds: Normal breath sounds.  Abdominal:     Palpations: Abdomen is soft.     Tenderness: There is no right CVA tenderness or left CVA tenderness.  Musculoskeletal:     Right lower leg: No edema.     Left lower leg: No edema.  Skin:    Findings: No lesion or rash.  Neurological:     Mental Status: She is alert and oriented to person, place, and time.     GCS: GCS eye subscore is 4. GCS verbal subscore is 5. GCS motor subscore is 6.     Cranial Nerves: No facial asymmetry.     Sensory: No sensory deficit.     Motor: No weakness.     Coordination: Coordination normal. Finger-Nose-Finger Test normal.     Gait: Gait normal.  Psychiatric:        Judgment: Judgment normal.     No results found for any visits on 05/11/24. Last CBC Lab Results  Component Value Date   WBC 8.8 06/25/2023   HGB 14.6 06/25/2023   HCT 46.1 06/25/2023   MCV 92 06/25/2023   MCH 29.3 06/25/2023   RDW 13.2 06/25/2023   PLT 305  06/25/2023   Last metabolic panel Lab Results  Component Value Date   GLUCOSE 86 06/25/2023   NA 138 06/25/2023   K 4.5 06/25/2023   CL 101 06/25/2023   CO2 24 06/25/2023   BUN 13 06/25/2023   CREATININE 0.76 06/25/2023   EGFR 88 06/25/2023   CALCIUM  10.0 06/25/2023   PROT 7.5 06/25/2023   ALBUMIN 4.6 06/25/2023   LABGLOB 2.9 06/25/2023   AGRATIO 1.8 09/15/2022   BILITOT 0.3 06/25/2023   ALKPHOS 85 06/25/2023   AST 26 06/25/2023   ALT 28 06/25/2023   Last lipids Lab Results  Component Value Date   CHOL 155 09/15/2022   HDL 62 09/15/2022   LDLCALC 77 09/15/2022   TRIG 82 09/15/2022   CHOLHDL 2.5 09/15/2022   Last hemoglobin A1c Lab Results  Component Value Date   HGBA1C 5.8 (H) 09/15/2022   Last thyroid  functions Lab Results  Component Value Date   TSH 1.510 09/15/2022   Last vitamin D  Lab Results  Component Value Date   VD25OH 45 12/14/2023   Last vitamin B12 and Folate No results found for: VITAMINB12, FOLATE      Assessment & Plan:    Routine Health Maintenance and Physical Exam  Immunization History  Administered Date(s) Administered   Influenza,inj,Quad PF,6+ Mos 06/26/2021  Influenza-Unspecified 05/20/2022   Moderna SARS-COV2 Booster Vaccination 06/07/2020, 05/19/2021   Moderna Sars-Covid-2 Vaccination 09/11/2019, 10/05/2019   PNEUMOCOCCAL CONJUGATE-20 05/11/2024   Tdap 03/29/2019   Zoster Recombinant(Shingrix ) 03/30/2019, 06/15/2019    Health Maintenance  Topic Date Due   COVID-19 Vaccine (5 - 2025-26 season) 05/27/2024 (Originally 04/24/2024)   Influenza Vaccine  11/21/2024 (Originally 03/24/2024)   Mammogram  10/31/2025   Colonoscopy  11/24/2025   Cervical Cancer Screening (HPV/Pap Cotest)  06/16/2027   DTaP/Tdap/Td (2 - Td or Tdap) 03/28/2029   Pneumococcal Vaccine: 50+ Years  Completed   Hepatitis C Screening  Completed   HIV Screening  Completed   Zoster Vaccines- Shingrix   Completed   Hepatitis B Vaccines 19-59 Average Risk   Aged Out   HPV VACCINES  Aged Out   Meningococcal B Vaccine  Aged Out    Discussed health benefits of physical activity, and encouraged her to engage in regular exercise appropriate for her age and condition.  Encounter for general adult medical examination with abnormal findings Assessment & Plan: Physical exam as documented Discussed heart-healthy diet  Encouraged to Exercise: If you are able: 30 -60 minutes a day ,4 days a week, or 150 minutes a week. The longer the better. Combine stretch, strength, and aerobic activities Encourage to eat whole Food, Plant Predominant Nutrition is highly recommended: Eat Plenty of vegetables, Mushrooms, fruits, Legumes, Whole Grains, Nuts, seeds in lieu of processed meats, processed snacks/pastries red meat, poultry, eggs.  Will f/u in 1 year for CPE      Encounter for immunization Assessment & Plan: Patient educated on CDC recommendation for the vaccine. Verbal consent was obtained from the patient, vaccine administered by nurse, no sign of adverse reactions noted at this time. Patient education on arm soreness and use of tylenol or ibuprofen for this patient  was discussed. Patient educated on the signs and symptoms of adverse effect and advise to contact the office if they occur.   Orders: -     Pneumococcal conjugate vaccine 20-valent  Hyperlipidemia LDL goal <100 -     Rosuvastatin  Calcium ; Take 1 tablet (10 mg total) by mouth daily.  Dispense: 90 tablet; Refill: 3  Primary hypertension -     amLODIPine  Besylate; Take 1 tablet (2.5 mg total) by mouth daily.  Dispense: 90 tablet; Refill: 1 -     VITAMIN D  25 Hydroxy (Vit-D Deficiency, Fractures)  Vitamin D  deficiency  Mixed hyperlipidemia -     Lipid panel -     CMP14+EGFR -     CBC with Differential/Platelet  IFG (impaired fasting glucose) -     Hemoglobin A1c  TSH (thyroid -stimulating hormone deficiency) -     TSH + free T4    No follow-ups on file.    Note: This chart has  been completed using Engineer, civil (consulting) software, and while attempts have been made to ensure accuracy, certain words and phrases may not be transcribed as intended.   Garrett Bowring, FNP

## 2024-05-11 NOTE — Assessment & Plan Note (Signed)
 Patient educated on CDC recommendation for the vaccine. Verbal consent was obtained from the patient, vaccine administered by nurse, no sign of adverse reactions noted at this time. Patient education on arm soreness and use of tylenol or ibuprofen for this patient  was discussed. Patient educated on the signs and symptoms of adverse effect and advise to contact the office if they occur.

## 2024-05-12 LAB — VITAMIN D 25 HYDROXY (VIT D DEFICIENCY, FRACTURES): Vit D, 25-Hydroxy: 44.1 ng/mL (ref 30.0–100.0)

## 2024-05-12 LAB — CBC WITH DIFFERENTIAL/PLATELET
Basophils Absolute: 0 x10E3/uL (ref 0.0–0.2)
Basos: 0 %
EOS (ABSOLUTE): 0.7 x10E3/uL — ABNORMAL HIGH (ref 0.0–0.4)
Eos: 7 %
Hematocrit: 43.5 % (ref 34.0–46.6)
Hemoglobin: 13.9 g/dL (ref 11.1–15.9)
Immature Grans (Abs): 0 x10E3/uL (ref 0.0–0.1)
Immature Granulocytes: 0 %
Lymphocytes Absolute: 1.6 x10E3/uL (ref 0.7–3.1)
Lymphs: 15 %
MCH: 29.5 pg (ref 26.6–33.0)
MCHC: 32 g/dL (ref 31.5–35.7)
MCV: 92 fL (ref 79–97)
Monocytes Absolute: 0.8 x10E3/uL (ref 0.1–0.9)
Monocytes: 8 %
Neutrophils Absolute: 7 x10E3/uL (ref 1.4–7.0)
Neutrophils: 70 %
Platelets: 293 x10E3/uL (ref 150–450)
RBC: 4.71 x10E6/uL (ref 3.77–5.28)
RDW: 12.5 % (ref 11.7–15.4)
WBC: 10.2 x10E3/uL (ref 3.4–10.8)

## 2024-05-12 LAB — CMP14+EGFR
ALT: 29 IU/L (ref 0–32)
AST: 24 IU/L (ref 0–40)
Albumin: 4.1 g/dL (ref 3.9–4.9)
Alkaline Phosphatase: 86 IU/L (ref 49–135)
BUN/Creatinine Ratio: 11 — ABNORMAL LOW (ref 12–28)
BUN: 9 mg/dL (ref 8–27)
Bilirubin Total: 0.3 mg/dL (ref 0.0–1.2)
CO2: 24 mmol/L (ref 20–29)
Calcium: 9.4 mg/dL (ref 8.7–10.3)
Chloride: 102 mmol/L (ref 96–106)
Creatinine, Ser: 0.79 mg/dL (ref 0.57–1.00)
Globulin, Total: 2.9 g/dL (ref 1.5–4.5)
Glucose: 89 mg/dL (ref 70–99)
Potassium: 4.7 mmol/L (ref 3.5–5.2)
Sodium: 139 mmol/L (ref 134–144)
Total Protein: 7 g/dL (ref 6.0–8.5)
eGFR: 83 mL/min/1.73 (ref >59)

## 2024-05-12 LAB — TSH+FREE T4
Free T4: 1.29 ng/dL (ref 0.82–1.77)
TSH: 2.02 u[IU]/mL (ref 0.450–4.500)

## 2024-05-12 LAB — HEMOGLOBIN A1C
Est. average glucose Bld gHb Est-mCnc: 117 mg/dL
Hgb A1c MFr Bld: 5.7 % — ABNORMAL HIGH (ref 4.8–5.6)

## 2024-05-12 LAB — LIPID PANEL
Chol/HDL Ratio: 2.7 ratio (ref 0.0–4.4)
Cholesterol, Total: 137 mg/dL (ref 100–199)
HDL: 51 mg/dL (ref >39)
LDL Chol Calc (NIH): 67 mg/dL (ref 0–99)
Triglycerides: 106 mg/dL (ref 0–149)
VLDL Cholesterol Cal: 19 mg/dL (ref 5–40)

## 2024-05-23 ENCOUNTER — Ambulatory Visit: Payer: Self-pay | Admitting: Family Medicine

## 2024-06-05 ENCOUNTER — Encounter (INDEPENDENT_AMBULATORY_CARE_PROVIDER_SITE_OTHER): Payer: Self-pay | Admitting: Gastroenterology

## 2024-06-07 ENCOUNTER — Encounter (INDEPENDENT_AMBULATORY_CARE_PROVIDER_SITE_OTHER): Payer: Self-pay | Admitting: Gastroenterology

## 2024-06-22 ENCOUNTER — Other Ambulatory Visit (HOSPITAL_COMMUNITY): Payer: Self-pay

## 2024-06-22 ENCOUNTER — Ambulatory Visit (INDEPENDENT_AMBULATORY_CARE_PROVIDER_SITE_OTHER): Admitting: Gastroenterology

## 2024-06-22 ENCOUNTER — Other Ambulatory Visit: Payer: Self-pay

## 2024-06-22 ENCOUNTER — Encounter (INDEPENDENT_AMBULATORY_CARE_PROVIDER_SITE_OTHER): Payer: Self-pay | Admitting: Gastroenterology

## 2024-06-22 DIAGNOSIS — K5190 Ulcerative colitis, unspecified, without complications: Secondary | ICD-10-CM

## 2024-06-22 DIAGNOSIS — K5100 Ulcerative (chronic) pancolitis without complications: Secondary | ICD-10-CM

## 2024-06-22 MED ORDER — MESALAMINE ER 0.375 G PO CP24
1.1250 g | ORAL_CAPSULE | Freq: Four times a day (QID) | ORAL | 3 refills | Status: AC
  Filled 2024-06-22 – 2024-08-03 (×2): qty 1080, 90d supply, fill #0

## 2024-06-22 NOTE — Patient Instructions (Signed)
 Continue apriso  4.5g daily Please make me aware if you have any new GI issues, otherwise we will plan to see you back in 1 year  It was a pleasure to see you today. I want to create trusting relationships with patients and provide genuine, compassionate, and quality care. I truly value your feedback! please be on the lookout for a survey regarding your visit with me today. I appreciate your input about our visit and your time in completing this!    Barbara Martin L. Fintan Grater, MSN, APRN, AGNP-C Adult-Gerontology Nurse Practitioner [facility] Gastroenterology at [facility]

## 2024-06-22 NOTE — Progress Notes (Addendum)
 Referring Provider: Edman Meade PEDLAR, FNP Primary Care Physician:  Edman Meade PEDLAR, FNP Primary GI Physician: Dr. Eartha   Chief Complaint  Patient presents with   Follow-up    Patient here today for a follow up on UC. Patient denies any current gi related issues. Patient still taking Apriso  0.375 g three capsules QID. She is not currently using the mesalamine  suppositories.    HPI:   Barbara Martin is a 63 y.o. female with past medical history of  ulcerative colitis, HLD, asthma, HLD   Patient presenting today for:  Follow up of UC  Last seen April, at that time, doing well. 1 formed stool per day, occasionally will skip a day  Recommended fecal calprotectin, continue apriso  1.5 gTID, vitamin D  levels, supplement Vitamin D   Calprotecin 79 and CRP <3 in april  Last labs 9/18 with vitamin D  45, TSh 2.02 , LIpiod panel WNL, CMP grossly unremarkable, CBC WNL  Present:  Doing well today, having 1 BM per day without abdominal pain, rectal bleeding, melena. Occasionally will skip a day but denies any constipation or diarrhea. Overall feeling well. No red flag symptoms. Patient denies melena, hematochezia, nausea, vomiting, diarrhea, constipation, dysphagia, odyonophagia, early satiety or weight loss.   Extraintestinal Manifestations: Skin: none  Joints: none  Eyes: none   Last flu shot:05/2024 Last pneumonia shot: 05/2024 Last Pap smear: January 2024 Last evaluation by dermatology: has upcoming appt in January  Last zoster vaccine: possibly 5 years ago Last DEXA scan: never, had steroids for short period in the past COVID-19 shot: Pfizer, 2 boosters   Last Colonoscopy:11/2022  - Perianal skin tags found on perianal exam.                           - One 5 mm polyp in the ascending colon, removed                            with a cold snare. Resected and retrieved.                           - Moderately active (Mayo Score 2) ulcerative                            colitis  up to 30 cm, unchanged since the last                            examination. Biopsied-chronic active colitis    Recommendations:  Repeat colonoscopy 3 years  Filed Weights   06/22/24 0803  Weight: 171 lb 14.4 oz (78 kg)     Past Medical History:  Diagnosis Date   Asthma    Hyperlipidemia    Rectal bleeding    Ulcerative colitis [facility])     Past Surgical History:  Procedure Laterality Date   BIOPSY  11/25/2022   Procedure: BIOPSY;  Surgeon: Eartha Angelia Sieving, MD;  Location: [facility];  Service: Gastroenterology;;   COLONOSCOPY N/A 09/04/2015   Procedure: COLONOSCOPY;  Surgeon: Claudis RAYMOND Rivet, MD;  Location: [facility];  Service: Endoscopy;  Laterality: N/A;  1:15 - moved to 12:15 - Ann to notify   COLONOSCOPY WITH PROPOFOL  N/A 11/25/2022   Procedure: COLONOSCOPY WITH PROPOFOL ;  Surgeon: Eartha Angelia Sieving, MD;  Location:  [facility];  Service: Gastroenterology;  Laterality: N/A;  1:00pm, asa 1-2   POLYPECTOMY  11/25/2022   Procedure: POLYPECTOMY;  Surgeon: Eartha Flavors, Toribio, MD;  Location: [facility];  Service: Gastroenterology;;   TUBAL LIGATION     1992   WRIST SURGERY     rt wrist surgery x 2 for a varicose vein    Current Outpatient Medications  Medication Sig Dispense Refill   amLODipine  (NORVASC ) 2.5 MG tablet Take 1 tablet (2.5 mg total) by mouth daily. 90 tablet 1   loratadine (CLARITIN) 10 MG tablet Take 10 mg by mouth daily.     mesalamine  (APRISO ) 0.375 g 24 hr capsule Take 3 capsules (1.125 g total) by mouth in the morning, at noon, in the evening, and at bedtime. 1080 capsule 3   Multiple Vitamin (MULTIVITAMIN) tablet Take 1 tablet by mouth daily.     rosuvastatin  (CRESTOR ) 10 MG tablet Take 1 tablet (10 mg total) by mouth daily. 90 tablet 3   Vitamin D , Cholecalciferol, 10 MCG (400 UNIT) CAPS Take 400 Units by mouth daily.     mesalamine  (CANASA ) 1000 MG suppository Place 1 suppository (1,000 mg total) rectally at bedtime every night  for a month, then every other night. (Patient not taking: Reported on 06/22/2024) 30 suppository 1   No current facility-administered medications for this visit.    Allergies as of 06/22/2024   (No Known Allergies)    Social History   Socioeconomic History   Marital status: Married    Spouse name: Not on file   Number of children: 1   Years of education: Not on file   Highest education level: Not on file  Occupational History   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  Tobacco Use   Smoking status: Never    Passive exposure: Never   Smokeless tobacco: Never  Vaping Use   Vaping status: Never Used  Substance and Sexual Activity   Alcohol use: No   Drug use: No   Sexual activity: Yes    Birth control/protection: Surgical  Other Topics Concern   Not on file  Social History Narrative   Not on file   Social Drivers of Health   Financial Resource Strain: Not on file  Food Insecurity: Not on file  Transportation Needs: Not on file  Physical Activity: Not on file  Stress: Not on file  Social Connections: Not on file    Review of systems General: negative for malaise, night sweats, fever, chills, weight loss Neck: Negative for lumps, goiter, pain and significant neck swelling Resp: Negative for cough, wheezing, dyspnea at rest CV: Negative for chest pain, leg swelling, palpitations, orthopnea GI: denies melena, hematochezia, nausea, vomiting, diarrhea, constipation, dysphagia, odyonophagia, early satiety or unintentional weight loss.  MSK: Negative for joint pain or swelling, back pain, and muscle pain. Derm: Negative for itching or rash Psych: Denies depression, anxiety, memory loss, confusion. No homicidal or suicidal ideation.  Heme: Negative for prolonged bleeding, bruising easily, and swollen nodes. Endocrine: Negative for cold or heat intolerance, polyuria, polydipsia and goiter. Neuro: negative for tremor, gait imbalance, syncope and seizures. The remainder of  the review of systems is noncontributory.  Physical Exam: BP 136/74 (BP Location: Right Arm, Patient Position: Sitting, Cuff Size: Normal)   Pulse (!) 105   Temp 97.8 F (36.6 C) (Temporal)   Ht 5' 4 (1.626 m)   Wt 171 lb 14.4 oz (78 kg)   LMP 02/01/2011   BMI 29.51  kg/m  General:   Alert and oriented. No distress noted. Pleasant and cooperative.  Head:  Normocephalic and atraumatic. Eyes:  Conjuctiva clear without scleral icterus. Mouth:  Oral mucosa pink and moist. Good dentition. No lesions. Heart: Normal rate and rhythm, s1 and s2 heart sounds present.  Lungs: Clear lung sounds in all lobes. Respirations equal and unlabored. Abdomen:  +BS, soft, non-tender and non-distended. No rebound or guarding. No HSM or masses noted. Derm: No palmar erythema or jaundice Msk:  Symmetrical without gross deformities. Normal posture. Extremities:  Without edema. Neurologic:  Alert and  oriented x4 Psych:  Alert and cooperative. Normal mood and affect.  Invalid input(s): 6 MONTHS   ASSESSMENT: Barbara Martin is a 63 y.o. female presenting today for follow up of ulcerative colitis  Patient doing well with 1 formed stool today, no abdominal pain, diarrhea or rectal bleeding.  Last colonoscopy in 2024 with a mild active colitis.  Change in therapy from mesalamine  has been discussed in the past the patient was hesitant to switch to anything else, however her last inflammatory markers were WNL and clinically she appears in remission. She is up to date on surveillance labs and preventative care as well.    PLAN:  -continue apriso  1.125g QID/4.5g daily -continue vitamin D  supplementation  All questions were answered, patient verbalized understanding and is in agreement with plan as outlined above.   Follow Up: 1 year   Kelissa Merlin L. Mariette, MSN, APRN, AGNP-C Adult-Gerontology Nurse Practitioner [facility]  I have reviewed the note and agree with the APP's  assessment as described in this progress note  Toribio Fortune, MD Gastroenterology and Hepatology [facility]astroenter[facility]

## 2024-06-22 NOTE — Progress Notes (Deleted)
 Doing well today, having 1 BM per day without abdominal pain, rectal bleeding, melena.   Pna and flu shot this year

## 2024-07-18 ENCOUNTER — Other Ambulatory Visit (HOSPITAL_COMMUNITY): Payer: Self-pay

## 2024-08-03 ENCOUNTER — Other Ambulatory Visit: Payer: Self-pay

## 2024-08-03 ENCOUNTER — Other Ambulatory Visit (HOSPITAL_COMMUNITY): Payer: Self-pay

## 2024-08-04 ENCOUNTER — Other Ambulatory Visit: Payer: Self-pay

## 2024-09-14 ENCOUNTER — Ambulatory Visit: Admitting: Family Medicine
